# Patient Record
Sex: Female | Born: 1935 | Race: White | Hispanic: No | Marital: Single | Smoking: Never smoker
Health system: Southern US, Community
[De-identification: ages and names within clinical notes are randomized; demographics above are authoritative.]

## PROBLEM LIST (undated history)

## (undated) DIAGNOSIS — E039 Hypothyroidism, unspecified: Secondary | ICD-10-CM

## (undated) DIAGNOSIS — E785 Hyperlipidemia, unspecified: Secondary | ICD-10-CM

## (undated) DIAGNOSIS — M1990 Unspecified osteoarthritis, unspecified site: Secondary | ICD-10-CM

## (undated) DIAGNOSIS — R350 Frequency of micturition: Secondary | ICD-10-CM

## (undated) DIAGNOSIS — I10 Essential (primary) hypertension: Secondary | ICD-10-CM

## (undated) HISTORY — PX: INCONTINENCE SURGERY: SHX676

## (undated) HISTORY — DX: Hypothyroidism, unspecified: E03.9

## (undated) HISTORY — DX: Frequency of micturition: R35.0

## (undated) HISTORY — DX: Essential (primary) hypertension: I10

## (undated) HISTORY — DX: Hyperlipidemia, unspecified: E78.5

## (undated) HISTORY — DX: Unspecified osteoarthritis, unspecified site: M19.90

---

## 2004-03-18 ENCOUNTER — Ambulatory Visit: Payer: Self-pay | Admitting: Ophthalmology

## 2004-05-04 ENCOUNTER — Emergency Department: Payer: Self-pay | Admitting: Internal Medicine

## 2005-04-24 ENCOUNTER — Ambulatory Visit: Payer: Self-pay | Admitting: Internal Medicine

## 2015-12-16 ENCOUNTER — Ambulatory Visit (INDEPENDENT_AMBULATORY_CARE_PROVIDER_SITE_OTHER): Payer: Medicare Other | Admitting: Urology

## 2015-12-16 ENCOUNTER — Encounter: Payer: Self-pay | Admitting: Urology

## 2015-12-16 ENCOUNTER — Telehealth: Payer: Self-pay | Admitting: Urology

## 2015-12-16 VITALS — BP 146/73 | HR 81 | Ht 60.0 in | Wt 102.1 lb

## 2015-12-16 DIAGNOSIS — R350 Frequency of micturition: Secondary | ICD-10-CM | POA: Diagnosis not present

## 2015-12-16 DIAGNOSIS — R351 Nocturia: Secondary | ICD-10-CM

## 2015-12-16 LAB — BLADDER SCAN AMB NON-IMAGING: Scan Result: 36

## 2015-12-16 NOTE — Progress Notes (Signed)
12/16/2015 11:34 AM   Kelly Caldwell 05-09-1915 [id number]  Referring provider: No referring provider defined for this encounter.  Chief Complaint  Patient presents with  . New Patient (Initial Visit)    urinary freq referred by Dr. Juel BurrowMasoud    HPI: Patient is a 82 year old Caucasian female who presents today with her daughter, Kelly Caldwell, as a referral from Dr. Fredna DowMasoud's office for urinary frequency.  Patient states that she has been experiencing nocturia x 7 for the last several months.  She has been tried on three different medications without relief.  She has also tried restricting fluids and delaying bedtime in an effort to decrease her nocturia.  She is going through two depends nightly.  She is not having difficulty with urination during the day.   She is not having associated dysuria, intermittency, hesitancy, straining to urinate and weak urinary stream.   She does not have a history of urinary tract infections, STI's or injury to the bladder.   She denies gross hematuria, suprapubic pain, back pain, abdominal pain or flank pain.   She has not had any recent fevers, chills, nausea or vomiting.   She does not have a history of nephrolithiasis, GU surgery or GU trauma.   She is post menopausal.   She denies constipation and/or diarrhea.   She is not having pain with bladder filling.    She has not had any recent imaging studies.    She is drinking 2 to 3 of water daily.   She is drinking an occasional caffeinated beverages daily.  She is not drinking alcoholic beverages.      Her PVR today is 36 mL.     PMH: Past Medical History:  Diagnosis Date  . Arthritis   . HLD (hyperlipidemia)   . HTN (hypertension)   . Hypothyroidism   . Urinary frequency     Surgical History: Past Surgical History:  Procedure Laterality Date  . INCONTINENCE SURGERY      Home Medications:    Medication List       Accurate as of 12/16/15 11:34 AM. Always use your most recent med  list.          ALPRAZolam 0.25 MG tablet Commonly known as:  XANAX Take 0.25 mg by mouth at bedtime as needed for anxiety.   AMLODIPINE BESYLATE PO Take by mouth.   aspirin EC 81 MG tablet Take 81 mg by mouth.   atorvastatin 10 MG tablet Commonly known as:  LIPITOR Take 10 mg by mouth daily.   HYDROcodone-acetaminophen 5-325 MG tablet Commonly known as:  NORCO/VICODIN Take by mouth.   levothyroxine 88 MCG tablet Commonly known as:  SYNTHROID, LEVOTHROID Take 88 mcg by mouth daily before breakfast.   lisinopril 10 MG tablet Commonly known as:  PRINIVIL,ZESTRIL Take 10 mg by mouth daily.   metFORMIN 500 MG tablet Commonly known as:  GLUCOPHAGE Take by mouth 2 (two) times daily with a meal.       Allergies: No Known Allergies  Family History: Family History  Problem Relation Age of Onset  . Kidney disease Neg Hx   . Bladder Cancer Neg Hx     Social History:  reports that she has never smoked. She has never used smokeless tobacco. She reports that she does not drink alcohol or use drugs.  ROS: UROLOGY Frequent Urination?: Yes Hard to postpone urination?: Yes Burning/pain with urination?: No Get up at night to urinate?: Yes Leakage of urine?: Yes Urine stream starts  and stops?: No Trouble starting stream?: No Do you have to strain to urinate?: No Blood in urine?: No Urinary tract infection?: No Sexually transmitted disease?: No Injury to kidneys or bladder?: No Painful intercourse?: No Weak stream?: No Currently pregnant?: No Vaginal bleeding?: No Last menstrual period?: n  Gastrointestinal Nausea?: No Vomiting?: No Indigestion/heartburn?: No Diarrhea?: No Constipation?: No  Constitutional Fever: No Night sweats?: No Weight loss?: No Fatigue?: No  Skin Skin rash/lesions?: No Itching?: No  Eyes Blurred vision?: No Double vision?: No  Ears/Nose/Throat Sore throat?: No Sinus problems?: No  Hematologic/Lymphatic Swollen glands?:  No Easy bruising?: Yes  Cardiovascular Leg swelling?: No Chest pain?: No  Respiratory Cough?: No Shortness of breath?: No  Endocrine Excessive thirst?: No  Musculoskeletal Back pain?: No Joint pain?: Yes  Neurological Headaches?: No Dizziness?: No  Psychologic Depression?: No Anxiety?: No  Physical Exam: BP (!) 146/73   Pulse 81   Ht 5' (1.524 m)   Wt 102 lb 1.6 oz (46.3 kg)   BMI 19.94 kg/m   Constitutional: Well nourished. Alert and oriented, No acute distress. HEENT: [place] AT, moist mucus membranes. Trachea midline, no masses. Cardiovascular: No clubbing, cyanosis, or edema. Respiratory: Normal respiratory effort, no increased work of breathing. GI: Abdomen is soft, non tender, non distended, no abdominal masses. Liver and spleen not palpable.  No hernias appreciated.  Stool sample for occult testing is not indicated.   GU: No CVA tenderness.  No bladder fullness or masses.   Skin: No rashes, bruises or suspicious lesions. Lymph: No cervical or inguinal adenopathy. Neurologic: Grossly intact, no focal deficits, moving all 4 extremities. Psychiatric: Normal mood and affect.  Laboratory Data: Pertinent Imaging: Results for Kelly MusaNGLE, Kelly (MRN [id number]) as of 12/16/2015 11:01  Ref. Range 12/16/2015 10:58  Scan Result Unknown 36    Assessment & Plan:    1. Nocturia  - I explained to the patient that nocturia is often multi-factorial and difficult to treat.  Sleeping disorders, heart conditions, peripheral vascular disease, diabetes, an enlarged prostate for men, an urethral stricture causing bladder outlet obstruction and/or certain medications can contribute to nocturia.  - I have suggested that the patient avoid caffeine after noon and alcohol in the evening.  He or she may also benefit from fluid restrictions after 6:00 in the evening and voiding just prior to bedtime.  - I have explained that research studies have showed that over 84% of patients with  sleep apnea reported frequent nighttime urination.   With sleep apnea, oxygen decreases, carbon dioxide increases, the blood become more acidic, the heart rate drops and blood vessels in the lung constrict.  The body is then alerted that something is very wrong. The sleeper must wake enough to reopen the airway. By this time, the heart is racing and experiences a false signal of fluid overload. The heart excretes a hormone-like protein that tells the body to get rid of sodium and water, resulting in nocturia.  -  I also informed the patient that a recent study noted that decreasing sodium intake to 2.3 grams daily, if they don't have issues with hyponatremia, can also reduce the number of nightly voids  - The patient may benefit from a discussion with his or her primary care physician to see if he or she has risk factors for sleep apnea or other sleep disturbances and obtaining a sleep study.  - BLADDER SCAN AMB NON-IMAGING   Return for follow up after sleep study.  These notes generated with voice recognition  software. I apologize for typographical errors.  Zara Council, [facility] [address], [place], [place] [address] [phone]

## 2015-12-16 NOTE — Telephone Encounter (Signed)
Please call Morrie SheldonAshley at Dr. Fredna DowMasoud's office and let them know that I recommend a sleep study.  The patient would like it after Thanksgiving.

## 2015-12-16 NOTE — Telephone Encounter (Signed)
Spoke with Morrie SheldonAshley at Dr. Fredna DowMasoud's office. Morrie Sheldonshley voiced understanding.

## 2016-06-21 ENCOUNTER — Emergency Department: Payer: Medicare Other

## 2016-06-21 ENCOUNTER — Inpatient Hospital Stay
Admission: EM | Admit: 2016-06-21 | Discharge: 2016-06-23 | DRG: 481 | Disposition: A | Discharge status: Skilled Nursing Facility | Payer: Medicare Other | Attending: Internal Medicine | Admitting: Internal Medicine

## 2016-06-21 ENCOUNTER — Encounter
Admission: EM | Disposition: A | Discharge status: Skilled Nursing Facility | Payer: Self-pay | Source: Home / Self Care | Attending: Internal Medicine

## 2016-06-21 ENCOUNTER — Inpatient Hospital Stay: Payer: Medicare Other | Admitting: Anesthesiology

## 2016-06-21 ENCOUNTER — Inpatient Hospital Stay: Payer: Medicare Other

## 2016-06-21 DIAGNOSIS — S72011A Unspecified intracapsular fracture of right femur, initial encounter for closed fracture: Principal | ICD-10-CM | POA: Diagnosis present

## 2016-06-21 DIAGNOSIS — I350 Nonrheumatic aortic (valve) stenosis: Secondary | ICD-10-CM | POA: Diagnosis not present

## 2016-06-21 DIAGNOSIS — M810 Age-related osteoporosis without current pathological fracture: Secondary | ICD-10-CM | POA: Diagnosis present

## 2016-06-21 DIAGNOSIS — I360 Nonrheumatic tricuspid (valve) stenosis: Secondary | ICD-10-CM | POA: Diagnosis not present

## 2016-06-21 DIAGNOSIS — N390 Urinary tract infection, site not specified: Secondary | ICD-10-CM | POA: Diagnosis not present

## 2016-06-21 DIAGNOSIS — W010XXA Fall on same level from slipping, tripping and stumbling without subsequent striking against object, initial encounter: Secondary | ICD-10-CM | POA: Diagnosis present

## 2016-06-21 DIAGNOSIS — Y92009 Unspecified place in unspecified non-institutional (private) residence as the place of occurrence of the external cause: Secondary | ICD-10-CM | POA: Diagnosis not present

## 2016-06-21 DIAGNOSIS — Z7984 Long term (current) use of oral hypoglycemic drugs: Secondary | ICD-10-CM

## 2016-06-21 DIAGNOSIS — Z66 Do not resuscitate: Secondary | ICD-10-CM | POA: Diagnosis present

## 2016-06-21 DIAGNOSIS — E119 Type 2 diabetes mellitus without complications: Secondary | ICD-10-CM | POA: Diagnosis present

## 2016-06-21 DIAGNOSIS — S72001A Fracture of unspecified part of neck of right femur, initial encounter for closed fracture: Secondary | ICD-10-CM

## 2016-06-21 DIAGNOSIS — E785 Hyperlipidemia, unspecified: Secondary | ICD-10-CM | POA: Diagnosis present

## 2016-06-21 DIAGNOSIS — E039 Hypothyroidism, unspecified: Secondary | ICD-10-CM | POA: Diagnosis present

## 2016-06-21 DIAGNOSIS — Z01810 Encounter for preprocedural cardiovascular examination: Secondary | ICD-10-CM | POA: Diagnosis not present

## 2016-06-21 DIAGNOSIS — E876 Hypokalemia: Secondary | ICD-10-CM | POA: Diagnosis present

## 2016-06-21 DIAGNOSIS — S72009A Fracture of unspecified part of neck of unspecified femur, initial encounter for closed fracture: Secondary | ICD-10-CM | POA: Diagnosis present

## 2016-06-21 DIAGNOSIS — Z7982 Long term (current) use of aspirin: Secondary | ICD-10-CM | POA: Diagnosis not present

## 2016-06-21 DIAGNOSIS — Z85828 Personal history of other malignant neoplasm of skin: Secondary | ICD-10-CM | POA: Diagnosis not present

## 2016-06-21 DIAGNOSIS — I10 Essential (primary) hypertension: Secondary | ICD-10-CM | POA: Diagnosis present

## 2016-06-21 DIAGNOSIS — M25551 Pain in right hip: Secondary | ICD-10-CM | POA: Diagnosis present

## 2016-06-21 HISTORY — PX: HIP PINNING,CANNULATED: SHX1758

## 2016-06-21 LAB — URINALYSIS, COMPLETE (UACMP) WITH MICROSCOPIC
Bilirubin Urine: NEGATIVE
Glucose, UA: 50 mg/dL — AB
HGB URINE DIPSTICK: NEGATIVE
Ketones, ur: NEGATIVE mg/dL
LEUKOCYTES UA: NEGATIVE
Nitrite: NEGATIVE
Protein, ur: NEGATIVE mg/dL
Specific Gravity, Urine: 1.005 (ref 1.005–1.030)
pH: 8 (ref 5.0–8.0)

## 2016-06-21 LAB — CBC WITH DIFFERENTIAL/PLATELET
Basophils Absolute: 0 10*3/uL (ref 0–0.1)
Basophils Relative: 0 %
EOS ABS: 0.1 10*3/uL (ref 0–0.7)
EOS PCT: 1 %
HCT: 37.5 % (ref 35.0–47.0)
Hemoglobin: 12.6 g/dL (ref 12.0–16.0)
LYMPHS ABS: 1.5 10*3/uL (ref 1.0–3.6)
LYMPHS PCT: 10 %
MCH: 33.3 pg (ref 26.0–34.0)
MCHC: 33.6 g/dL (ref 32.0–36.0)
MCV: 99 fL (ref 80.0–100.0)
MONO ABS: 1.2 10*3/uL — AB (ref 0.2–0.9)
MONOS PCT: 8 %
Neutro Abs: 12.4 10*3/uL — ABNORMAL HIGH (ref 1.4–6.5)
Neutrophils Relative %: 81 %
PLATELETS: 235 10*3/uL (ref 150–440)
RBC: 3.79 MIL/uL — ABNORMAL LOW (ref 3.80–5.20)
RDW: 14.7 % — AB (ref 11.5–14.5)
WBC: 15.3 10*3/uL — AB (ref 3.6–11.0)

## 2016-06-21 LAB — COMPREHENSIVE METABOLIC PANEL
ALK PHOS: 125 U/L (ref 38–126)
ALT: 19 U/L (ref 14–54)
AST: 31 U/L (ref 15–41)
Albumin: 3.7 g/dL (ref 3.5–5.0)
Anion gap: 8 (ref 5–15)
BUN: 12 mg/dL (ref 6–20)
CALCIUM: 9.4 mg/dL (ref 8.9–10.3)
CO2: 30 mmol/L (ref 22–32)
CREATININE: 0.56 mg/dL (ref 0.44–1.00)
Chloride: 102 mmol/L (ref 101–111)
GFR calc non Af Amer: >60 mL/min (ref >60)
GLUCOSE: 180 mg/dL — AB (ref 65–99)
Potassium: 3 mmol/L — ABNORMAL LOW (ref 3.5–5.1)
SODIUM: 140 mmol/L (ref 135–145)
Total Bilirubin: 0.7 mg/dL (ref 0.3–1.2)
Total Protein: 7.3 g/dL (ref 6.5–8.1)

## 2016-06-21 LAB — GLUCOSE, CAPILLARY
GLUCOSE-CAPILLARY: 179 mg/dL — AB (ref 65–99)
GLUCOSE-CAPILLARY: 138 mg/dL — AB (ref 65–99)
GLUCOSE-CAPILLARY: 220 mg/dL — AB (ref 65–99)
Glucose-Capillary: 134 mg/dL — ABNORMAL HIGH (ref 65–99)

## 2016-06-21 LAB — SURGICAL PCR SCREEN
MRSA, PCR: NEGATIVE
STAPHYLOCOCCUS AUREUS: NEGATIVE

## 2016-06-21 LAB — BRAIN NATRIURETIC PEPTIDE: B Natriuretic Peptide: 145 pg/mL — ABNORMAL HIGH (ref 0.0–100.0)

## 2016-06-21 LAB — TROPONIN I: Troponin I: <0.03 ng/mL (ref <0.03)

## 2016-06-21 SURGERY — FIXATION, FEMUR, NECK, PERCUTANEOUS, USING SCREW
Anesthesia: Spinal | Laterality: Right

## 2016-06-21 MED ORDER — SODIUM CHLORIDE 0.9 % IV SOLN
Freq: Once | INTRAVENOUS | Status: AC
  Administered 2016-06-21: 19:00:00 via INTRAVENOUS

## 2016-06-21 MED ORDER — ADULT MULTIVITAMIN W/MINERALS CH
1.0000 | ORAL_TABLET | Freq: Every day | ORAL | Status: DC
  Administered 2016-06-22 – 2016-06-23 (×2): 1 via ORAL
  Filled 2016-06-21 (×2): qty 1

## 2016-06-21 MED ORDER — DOCUSATE SODIUM 100 MG PO CAPS
100.0000 mg | ORAL_CAPSULE | Freq: Every day | ORAL | Status: DC
  Administered 2016-06-22 – 2016-06-23 (×2): 100 mg via ORAL
  Filled 2016-06-21 (×2): qty 1

## 2016-06-21 MED ORDER — FUROSEMIDE 10 MG/ML IJ SOLN
5.0000 mg | Freq: Once | INTRAMUSCULAR | Status: AC
  Administered 2016-06-21: 5 mg via INTRAVENOUS

## 2016-06-21 MED ORDER — PROPOFOL 500 MG/50ML IV EMUL
INTRAVENOUS | Status: AC
  Filled 2016-06-21: qty 50

## 2016-06-21 MED ORDER — VITAMIN C 500 MG PO TABS
1000.0000 mg | ORAL_TABLET | Freq: Every day | ORAL | Status: DC
  Administered 2016-06-22 – 2016-06-23 (×2): 1000 mg via ORAL
  Filled 2016-06-21 (×2): qty 2

## 2016-06-21 MED ORDER — FUROSEMIDE 10 MG/ML IJ SOLN
INTRAMUSCULAR | Status: AC
  Filled 2016-06-21: qty 2

## 2016-06-21 MED ORDER — ENOXAPARIN SODIUM 30 MG/0.3ML ~~LOC~~ SOLN
30.0000 mg | SUBCUTANEOUS | Status: DC
  Administered 2016-06-22 – 2016-06-23 (×2): 30 mg via SUBCUTANEOUS
  Filled 2016-06-21 (×2): qty 0.3

## 2016-06-21 MED ORDER — ATORVASTATIN CALCIUM 20 MG PO TABS
40.0000 mg | ORAL_TABLET | Freq: Every day | ORAL | Status: DC
  Administered 2016-06-21 – 2016-06-22 (×2): 40 mg via ORAL
  Filled 2016-06-21 (×2): qty 2

## 2016-06-21 MED ORDER — ONDANSETRON HCL 4 MG/2ML IJ SOLN
4.0000 mg | Freq: Four times a day (QID) | INTRAMUSCULAR | Status: DC | PRN
  Administered 2016-06-22: 4 mg via INTRAVENOUS
  Filled 2016-06-21: qty 2

## 2016-06-21 MED ORDER — MORPHINE SULFATE (PF) 2 MG/ML IV SOLN
2.0000 mg | Freq: Once | INTRAVENOUS | Status: AC
  Administered 2016-06-21: 2 mg via INTRAVENOUS
  Filled 2016-06-21: qty 1

## 2016-06-21 MED ORDER — ONDANSETRON HCL 4 MG PO TABS: 4.0000 mg | ORAL_TABLET | Freq: Four times a day (QID) | ORAL | Status: DC | PRN

## 2016-06-21 MED ORDER — TRAMADOL HCL 50 MG PO TABS
50.0000 mg | ORAL_TABLET | Freq: Four times a day (QID) | ORAL | Status: DC | PRN
Start: 2016-06-21 — End: 2016-06-23
  Administered 2016-06-21: 50 mg via ORAL
  Filled 2016-06-21: qty 1

## 2016-06-21 MED ORDER — AMLODIPINE BESYLATE 5 MG PO TABS
2.5000 mg | ORAL_TABLET | Freq: Every day | ORAL | Status: DC
  Administered 2016-06-22 – 2016-06-23 (×2): 2.5 mg via ORAL
  Filled 2016-06-21 (×2): qty 1

## 2016-06-21 MED ORDER — BUPIVACAINE HCL (PF) 0.5 % IJ SOLN
INTRAMUSCULAR | Status: DC | PRN
  Administered 2016-06-21: 2.5 mL

## 2016-06-21 MED ORDER — LISINOPRIL 5 MG PO TABS
2.5000 mg | ORAL_TABLET | Freq: Every day | ORAL | Status: DC
  Administered 2016-06-22 – 2016-06-23 (×2): 2.5 mg via ORAL
  Filled 2016-06-21 (×2): qty 1

## 2016-06-21 MED ORDER — SODIUM CHLORIDE 0.9 % IV SOLN
INTRAVENOUS | Status: DC | PRN
  Administered 2016-06-21: 16:00:00 via INTRAVENOUS

## 2016-06-21 MED ORDER — ONDANSETRON HCL 4 MG/2ML IJ SOLN
4.0000 mg | Freq: Once | INTRAMUSCULAR | Status: AC
Start: 2016-06-21 — End: 2016-06-21
  Administered 2016-06-21: 4 mg via INTRAVENOUS
  Filled 2016-06-21: qty 2

## 2016-06-21 MED ORDER — ACETAMINOPHEN 650 MG RE SUPP: 650.0000 mg | Freq: Four times a day (QID) | RECTAL | Status: DC | PRN

## 2016-06-21 MED ORDER — BUPIVACAINE HCL (PF) 0.5 % IJ SOLN
INTRAMUSCULAR | Status: AC
  Filled 2016-06-21: qty 10

## 2016-06-21 MED ORDER — PROPOFOL 10 MG/ML IV BOLUS
INTRAVENOUS | Status: DC | PRN
  Administered 2016-06-21 (×2): 10 mg via INTRAVENOUS

## 2016-06-21 MED ORDER — METOPROLOL TARTRATE 5 MG/5ML IV SOLN: 5.0000 mg | INTRAVENOUS | Status: DC | PRN

## 2016-06-21 MED ORDER — CEFAZOLIN SODIUM 1 G IJ SOLR
INTRAMUSCULAR | Status: AC
  Filled 2016-06-21: qty 10

## 2016-06-21 MED ORDER — ACETAMINOPHEN 325 MG PO TABS
325.0000 mg | ORAL_TABLET | Freq: Four times a day (QID) | ORAL | Status: DC | PRN
  Administered 2016-06-22 – 2016-06-23 (×2): 325 mg via ORAL
  Filled 2016-06-21 (×2): qty 1

## 2016-06-21 MED ORDER — INSULIN ASPART 100 UNIT/ML ~~LOC~~ SOLN
0.0000 [IU] | Freq: Every day | SUBCUTANEOUS | Status: DC
  Administered 2016-06-21: 2 [IU] via SUBCUTANEOUS
  Filled 2016-06-21: qty 2

## 2016-06-21 MED ORDER — CEFAZOLIN SODIUM 1 G IJ SOLR
INTRAMUSCULAR | Status: DC | PRN
  Administered 2016-06-21: 1 g via INTRAMUSCULAR

## 2016-06-21 MED ORDER — INSULIN ASPART 100 UNIT/ML ~~LOC~~ SOLN
0.0000 [IU] | Freq: Three times a day (TID) | SUBCUTANEOUS | Status: DC
  Administered 2016-06-22: 1 [IU] via SUBCUTANEOUS
  Administered 2016-06-22 – 2016-06-23 (×3): 2 [IU] via SUBCUTANEOUS
  Administered 2016-06-23: 1 [IU] via SUBCUTANEOUS
  Filled 2016-06-21: qty 2
  Filled 2016-06-21 (×2): qty 1
  Filled 2016-06-21 (×2): qty 2

## 2016-06-21 MED ORDER — LEVOTHYROXINE SODIUM 100 MCG PO TABS: 100.0000 ug | ORAL_TABLET | Freq: Every day | ORAL | Status: DC

## 2016-06-21 MED ORDER — PANTOPRAZOLE SODIUM 40 MG PO TBEC
40.0000 mg | DELAYED_RELEASE_TABLET | Freq: Every day | ORAL | Status: DC
  Administered 2016-06-22 – 2016-06-23 (×2): 40 mg via ORAL
  Filled 2016-06-21 (×2): qty 1

## 2016-06-21 MED ORDER — PROPOFOL 500 MG/50ML IV EMUL
INTRAVENOUS | Status: DC | PRN
  Administered 2016-06-21: 25 ug/kg/min via INTRAVENOUS

## 2016-06-21 MED ORDER — ALPRAZOLAM 0.5 MG PO TABS
0.5000 mg | ORAL_TABLET | Freq: Every day | ORAL | Status: DC
  Administered 2016-06-21 – 2016-06-22 (×2): 0.5 mg via ORAL
  Filled 2016-06-21 (×2): qty 1

## 2016-06-21 MED ORDER — MORPHINE SULFATE (PF) 2 MG/ML IV SOLN: 2.0000 mg | INTRAVENOUS | Status: DC | PRN

## 2016-06-21 MED ORDER — POTASSIUM CHLORIDE CRYS ER 20 MEQ PO TBCR
40.0000 meq | EXTENDED_RELEASE_TABLET | ORAL | Status: AC
  Administered 2016-06-21: 40 meq via ORAL
  Filled 2016-06-21: qty 2

## 2016-06-21 SURGICAL SUPPLY — 32 items
BAG COUNTER SPONGE EZ (MISCELLANEOUS) ×2 IMPLANT
BIT DRILL CANN LRG QC 5X300 (BIT) ×2 IMPLANT
CANISTER SUCT 1200ML W/VALVE (MISCELLANEOUS) ×2 IMPLANT
DRSG OPSITE POSTOP 4X6 (GAUZE/BANDAGES/DRESSINGS) ×2 IMPLANT
DURAPREP 26ML APPLICATOR (WOUND CARE) ×2 IMPLANT
ELECT REM PT RETURN 9FT ADLT (ELECTROSURGICAL) ×2
ELECTRODE REM PT RTRN 9FT ADLT (ELECTROSURGICAL) ×1 IMPLANT
GAUZE SPONGE 4X4 12PLY STRL (GAUZE/BANDAGES/DRESSINGS) IMPLANT
GLOVE BIOGEL PI IND STRL 8 (GLOVE) ×1 IMPLANT
GLOVE BIOGEL PI INDICATOR 8 (GLOVE) ×1
GLOVE INDICATOR 7.5 STRL GRN (GLOVE) ×2 IMPLANT
GLOVE SURG ORTHO 8.0 STRL STRW (GLOVE) ×6 IMPLANT
GOWN STRL REUS W/ TWL LRG LVL3 (GOWN DISPOSABLE) ×2 IMPLANT
GOWN STRL REUS W/TWL LRG LVL3 (GOWN DISPOSABLE) ×2
GUIDEWIRE THREADED 2.8 (WIRE) ×8 IMPLANT
IRRIGATION STRYKERFLOW (MISCELLANEOUS) IMPLANT
IRRIGATOR STRYKERFLOW (MISCELLANEOUS)
KIT RM TURNOVER CYSTO AR (KITS) ×2 IMPLANT
KIT RM TURNOVER STRD PROC AR (KITS) ×2 IMPLANT
MAT FLOOR SUCTION 50X34 (MISCELLANEOUS) IMPLANT
NS IRRIG 1000ML POUR BTL (IV SOLUTION) ×2 IMPLANT
PACK HIP COMPR (MISCELLANEOUS) ×2 IMPLANT
SCREW CANN 16 THRD/70 7.3 (Screw) ×2 IMPLANT
SCREW CANN 16 THRD/75 7.3 (Screw) ×4 IMPLANT
STAPLER SKIN PROX 35W (STAPLE) ×2 IMPLANT
SUT VIC AB 0 CT1 36 (SUTURE) ×2 IMPLANT
SUT VIC AB 1 CT1 36 (SUTURE) ×4 IMPLANT
SUT VIC AB 2-0 CT1 27 (SUTURE) ×1
SUT VIC AB 2-0 CT1 TAPERPNT 27 (SUTURE) ×1 IMPLANT
TAPE MICROFOAM 4IN (TAPE) IMPLANT
WASHER FOR 5.0 SCREWS (Washer) ×6 IMPLANT
WATER STERILE IRR 1000ML POUR (IV SOLUTION) ×2 IMPLANT

## 2016-06-21 NOTE — Anesthesia Post-op Follow-up Note (Cosign Needed)
Anesthesia QCDR form completed.        

## 2016-06-21 NOTE — Anesthesia Procedure Notes (Signed)
Spinal  Patient location during procedure: OR Start time: 06/21/2016 4:20 PM End time: 06/21/2016 4:25 PM Staffing Resident/CRNA: Nelda Marseille Performed: resident/CRNA  Preanesthetic Checklist Completed: patient identified, site marked, surgical consent, pre-op evaluation, timeout performed, IV checked, risks and benefits discussed and monitors and equipment checked Spinal Block Patient position: right lateral decubitus Prep: Betadine Patient monitoring: heart rate, continuous pulse ox, blood pressure and cardiac monitor Approach: left paramedian Location: L3-4 Injection technique: single-shot Needle Needle type: [place] and Introducer  Needle gauge: 25 G Needle length: 9 cm Assessment Sensory level: T10 Additional Notes Negative paresthesia. Negative blood return. Positive free-flowing CSF. Expiration date of kit checked and confirmed. Patient tolerated procedure well, without complications.

## 2016-06-21 NOTE — Clinical Social Work Note (Signed)
Clinical Social Work Assessment  Patient Details  Name: Kelly Caldwell MRN: [id number] Date of Birth: Jan 11, 1916  Date of referral:  06/21/16               Reason for consult:  Discharge Planning, Facility Placement                Permission sought to share information with:  Family Supports, Customer service manager Permission granted to share information::  Yes, Verbal Permission Granted  Name::        Agency::  All facilities  Relationship::     Contact Information:  Daughter Terrial Rhodes [phone] Solon Palm Son in [place]  Housing/Transportation Living arrangements for the past 2 months:  [place] of Information:  Adult Children Patient Interpreter Needed:  None Criminal Activity/Legal Involvement Pertinent to Current Situation/Hospitalization:    Significant Relationships:  Adult Children Lives with:    Do you feel safe going back to the place where you live?  Yes Need for family participation in patient care:  Yes (Comment)  Care giving concerns: Family reports patient very independent and has had some recent falls   Facilities manager / plan: LCSW met with patient and obtained verbal consent to speak to her present family members. Introduced myself to her daughter Terrial Rhodes and son in law Solon Palm [phone]. Patient is a 82 year old female who up to 3 days ago was able to ambulate, dress herself, make her meals and take care of personal needs and is oriented x4. As per daughter she lives in her own home across the street and they visit and support patient daily. Patient is hard of hearing ,good vision and able to verbalize her needs. Family is agreeable to have patient go to STR/SNF if this is recommended by doctor or  Pt can return home with in home supports. They will make a decision closer to d/c. Family is OK to send out information to SNF for discharge planning.  Employment status:  Retired Forensic scientist:  Other  (Comment Required) Therapist, occupational) PT Recommendations:    Information / Referral to community resources:  [place]  Patient/Family's Response to care:  Family has a good understanding of patients needs and will consider STR/SNF if required and pleased all questions answered  Patient/Family's Understanding of and Emotional Response to Diagnosis, Current Treatment, and Prognosis:  Family has good understanding of admission process and discharge planning  Emotional Assessment Appearance:  Appears stated age Attitude/Demeanor/Rapport:   (Calm polite and oriented x4) Affect (typically observed):  Accepting, Calm, Hopeful Orientation:  Oriented to Self, Oriented to Place, Oriented to Situation Alcohol / Substance use:  Not Applicable Psych involvement (Current and /or in the community):  No (Comment)  Discharge Needs  Concerns to be addressed:  Adjustment to Illness Readmission within the last 30 days:    Current discharge risk:  None Barriers to Discharge:  Continued Medical Work up   [place], [place], LCSW 06/21/2016, 11:25 AM

## 2016-06-21 NOTE — Consult Note (Signed)
CARDIOLOGY CONSULT NOTE     Primary Care Physician: Corky DownsMasoud, Javed, MD Referring Physician:  Dr Amado CoeGouru  Admit Date: 06/21/2016  Reason for consultation:  preop assessment  Kelly MusaMarguerite Moncur is a 83 y.o. female with a h/o no prior cardiac history who now presents with hip fracture.  Cardiology is consulted for preoperative clearance.  The patient reports being "healthy" and has not had any prior cardiac history.  This am, she reports that she tripped over clothes and fell.  She has developed R femoral fracture.  She is clear that she did not have syncope prior to her fall. She is not very active.  Denies prior cardiac symptoms.   Today, she denies symptoms of palpitations, chest pain, shortness of breath, orthopnea, PND, lower extremity edema, dizziness, presyncope, syncope, or neurologic sequela. The patient is tolerating medications without difficulties and is otherwise without complaint today.   Past Medical History:  Diagnosis Date  . Arthritis   . HLD (hyperlipidemia)   . HTN (hypertension)   . Hypothyroidism   . Urinary frequency    Past Surgical History:  Procedure Laterality Date  . INCONTINENCE SURGERY      . ALPRAZolam  0.5 mg Oral QHS  . amLODipine  2.5 mg Oral Daily  . atorvastatin  40 mg Oral QHS  . docusate sodium  100 mg Oral Daily  . insulin aspart  0-5 Units Subcutaneous QHS  . insulin aspart  0-9 Units Subcutaneous TID WC  . levothyroxine  100 mcg Oral QAC breakfast  . lisinopril  2.5 mg Oral Daily  . multivitamin with minerals  1 tablet Oral Daily  . pantoprazole  40 mg Oral Daily  . potassium chloride  40 mEq Oral Q4H  . vitamin C  1,000 mg Oral Daily     No Known Allergies  Social History   Social History  . Marital status: Single    Spouse name: N/A  . Number of children: N/A  . Years of education: N/A   Occupational History  . Not on file.   Social History Main Topics  . Smoking status: Never Smoker  . Smokeless tobacco: Never Used  .  Alcohol use No  . Drug use: No  . Sexual activity: Not on file   Other Topics Concern  . Not on file   Social History Narrative   Lives alone in     Family History  Problem Relation Age of Onset  . Kidney disease Neg Hx   . Bladder Cancer Neg Hx   son died of cancer  ROS- All systems are reviewed and negative except as per the HPI above  Physical Exam: Telemetry: Vitals:   06/21/16 0830 06/21/16 1037 06/21/16 1329  BP: (!) 186/81 (!) 154/76 (!) 160/80  Pulse: 82 79   Resp: 16 18 16   Temp: 98.1 F (36.7 C)  98.4 F (36.9 C)  TempSrc: Oral  Oral  SpO2: (!) 89% 94% 98%  Weight: 100 lb (45.4 kg)    Height: 5' (1.524 m)      GEN- The patient is elderly and very thin appearing, alert and oriented x 3 today.   Head- normocephalic, atraumatic Eyes-  Sclera clear, conjunctiva pink Ears- hearing intact Oropharynx- clear Neck- supple,   Lungs- Clear to ausculation bilaterally, normal work of breathing Heart- Regular rate and rhythm, 2/6 SEM LUSB which is early peaking GI- soft, NT, ND, + BS Extremities- no clubbing, cyanosis, or edema MS- diffuse muscle atrophy Skin- no rash or lesion Psych-  euthymic mood, full affect Neuro- strength and sensation are intact  EKG tracing is personally reviewed and reveals sinus rhythm with PACs, no ischemic changes  Labs:   Lab Results  Component Value Date   WBC 15.3 (H) 06/21/2016   HGB 12.6 06/21/2016   HCT 37.5 06/21/2016   MCV 99.0 06/21/2016   PLT 235 06/21/2016    Recent Labs Lab 06/21/16 0835  NA 140  K 3.0*  CL 102  CO2 30  BUN 12  CREATININE 0.56  CALCIUM 9.4  PROT 7.3  BILITOT 0.7  ALKPHOS 125  ALT 19  AST 31  GLUCOSE 180*    ASSESSMENT AND PLAN:    1. Preoperative assessment Pt without prior cardiac history or symptoms, though not very active. Given her advanced age, I do think that she would be at increased risk for any procedures.  I do not feel that further CV testing however would  reduce her surgical risk.  I would therefore advise that she proceed with surgery if medically indicated without further CV testing at this time. Please call cardiology if she develops any difficulty while here.  Cardiology to see as needed otherwise.   Hillis Range, MD 06/21/2016  2:20 PM

## 2016-06-21 NOTE — ED Notes (Addendum)
1st attempt at catheter insertion-unsuccessful by this nurse; receiving nurse notified

## 2016-06-21 NOTE — ED Triage Notes (Addendum)
Pt came to ED via EMS from home. Pt has unwitnessed fall, pt does not remember falling. Thinks she slipped out the end of her bed. Reports right hip pain. Alert and oriented. Not on blood thinners.

## 2016-06-21 NOTE — Anesthesia Preprocedure Evaluation (Addendum)
Anesthesia Evaluation  Patient identified by MRN, date of birth, ID band Patient awake    Reviewed: Allergy & Precautions, NPO status , Patient's Chart, lab work & pertinent test results  Airway Mallampati: III  TM Distance: <3 FB     Dental  (+) Missing, Caps   Pulmonary neg pulmonary ROS,    Pulmonary exam normal        Cardiovascular hypertension, Pt. on medications Normal cardiovascular exam     Neuro/Psych negative neurological ROS  negative psych ROS   GI/Hepatic   Endo/Other  diabetes, Type 2, Oral Hypoglycemic AgentsHypothyroidism   Renal/GU  Bladder dysfunction      Musculoskeletal  (+) Arthritis , Osteoarthritis,    Abdominal Normal abdominal exam  (+)   Peds negative pediatric ROS (+)  Hematology   Anesthesia Other Findings Past Medical History: No date: Arthritis No date: HLD (hyperlipidemia) No date: HTN (hypertension) No date: Hypothyroidism No date: Urinary frequency  Reproductive/Obstetrics                           Anesthesia Physical Anesthesia Plan  ASA: III and emergent  Anesthesia Plan: Spinal   Post-op Pain Management:    Induction: Intravenous  Airway Management Planned: Nasal Cannula  Additional Equipment:   Intra-op Plan:   Post-operative Plan:   Informed Consent: I have reviewed the patients History and Physical, chart, labs and discussed the procedure including the risks, benefits and alternatives for the proposed anesthesia with the patient or authorized representative who has indicated his/her understanding and acceptance.     Plan Discussed with: CRNA and Surgeon  Anesthesia Plan Comments:         Anesthesia Quick Evaluation

## 2016-06-21 NOTE — H&P (Signed)
[facility] - [place] at [facility]   PATIENT NAME: Kelly Caldwell    MR#:  [id number]  DATE OF BIRTH:  08-07-1915  DATE OF ADMISSION:  06/21/2016  PRIMARY CARE PHYSICIAN: Corky Downs, MD   REQUESTING/REFERRING PHYSICIAN: Malinda  CHIEF COMPLAINT:   fall HISTORY OF PRESENT ILLNESS:  Kelly Caldwell  is a 83 y.o. female with a known history of HTN,hypothyroidism, DM,hyperlipidemia and basal cell carcinoma of skin is brought into the emergency department after she sustained a fall. She was reporting right hip pain. X-ray has revealed impacted right femoral subcapital fracture. Chest x-ray with pulmonary vascular congestion but patient does not have any history of congestive heart failure or acute MI in the past. She could ambulate with the help of walker and minimal assistance  PAST MEDICAL HISTORY:   Past Medical History:  Diagnosis Date  . Arthritis   . HLD (hyperlipidemia)   . HTN (hypertension)   . Hypothyroidism   . Urinary frequency     PAST SURGICAL HISTOIRY:   Past Surgical History:  Procedure Laterality Date  . INCONTINENCE SURGERY      SOCIAL HISTORY:   Social History  Substance Use Topics  . Smoking status: Never Smoker  . Smokeless tobacco: Never Used  . Alcohol use No    FAMILY HISTORY:   Family History  Problem Relation Age of Onset  . Kidney disease Neg Hx   . Bladder Cancer Neg Hx     DRUG ALLERGIES:  No Known Allergies  REVIEW OF SYSTEMS:  CONSTITUTIONAL: No fever, fatigue or weakness.  EYES: No blurred or double vision.  EARS, NOSE, AND THROAT: No tinnitus or ear pain.  RESPIRATORY: No cough, shortness of breath, wheezing or hemoptysis.  CARDIOVASCULAR: No chest pain, orthopnea, edema.  GASTROINTESTINAL: No nausea, vomiting, diarrhea or abdominal pain.  GENITOURINARY: No dysuria, hematuria.  ENDOCRINE: No polyuria, nocturia,  HEMATOLOGY: No anemia, easy bruising or bleeding SKIN: No rash or  lesion. MUSCULOSKELETAL: Reporting right hip pain NEUROLOGIC: No tingling, numbness, weakness.  PSYCHIATRY: No anxiety or depression.   MEDICATIONS AT HOME:   Prior to Admission medications   Medication Sig Start Date End Date Taking? Authorizing Provider  ALPRAZolam Prudy Feeler) 0.5 MG tablet Take 0.5 mg by mouth at bedtime.    Yes [provider]  amLODipine (NORVASC) 2.5 MG tablet Take 1 tablet by mouth daily.    Yes [provider]  Ascorbic Acid (VITAMIN C) 1000 MG tablet Take 1,000 mg by mouth daily.   Yes [provider]  aspirin EC 81 MG tablet Take 81 mg by mouth at bedtime.    Yes [provider]  atorvastatin (LIPITOR) 40 MG tablet Take 40 mg by mouth at bedtime.    Yes [provider]  cholecalciferol (VITAMIN D) 1000 units tablet Take 4,000 Units by mouth daily.   Yes [provider]  Coenzyme Q10 (CO Q-10) 200 MG CAPS Take 1 capsule by mouth daily.   Yes [provider]  ibuprofen (ADVIL,MOTRIN) 200 MG tablet Take 200 mg by mouth every 6 (six) hours as needed.   Yes [provider]  levothyroxine (SYNTHROID, LEVOTHROID) 100 MCG tablet Take 100 mcg by mouth daily before breakfast.    Yes [provider]  lisinopril (PRINIVIL,ZESTRIL) 2.5 MG tablet Take 2.5 mg by mouth daily.    Yes [provider]  metFORMIN (GLUCOPHAGE) 1000 MG tablet Take 1,000 mg by mouth 2 (two) times daily with a meal.    Yes  [provider]  Multiple Vitamin (MULTIVITAMIN) tablet Take 1 tablet by mouth daily.   Yes [provider]      VITAL SIGNS:  Blood pressure (!) 154/76, pulse 79, temperature 98.1 F (36.7 C), temperature source Oral, resp. rate 18, height 5' (1.524 m), weight 45.4 kg (100 lb), SpO2 94 %.  PHYSICAL EXAMINATION:  GENERAL:  83 y.o.-year-old patient lying in the bed with no acute distress.  EYES: Pupils equal, round, reactive to light and accommodation. No scleral icterus.  Extraocular muscles intact.  HEENT: Head atraumatic, normocephalic. Oropharynx and nasopharynx clear.  NECK:  Supple, no jugular venous distention. No thyroid enlargement, no tenderness.  LUNGS: Normal breath sounds bilaterally, no wheezing, rales,rhonchi or crepitation. No use of accessory muscles of respiration.  CARDIOVASCULAR: S1, S2 normal. Has systolic murmurs, no  rubs, or gallops.  ABDOMEN: Soft, nontender, nondistended. Bowel sounds present. No organomegaly or mass.  EXTREMITIES: Right hip is tender abducted No pedal edema, cyanosis, or clubbing.  NEUROLOGIC: Cranial nerves II through XII are intact.sensory intact Gait not checked.  PSYCHIATRIC: The patient is alert and oriented x 3.  SKIN: No obvious rash, lesion, or ulcer.   LABORATORY PANEL:   CBC  Recent Labs Lab 06/21/16 0835  WBC 15.3*  HGB 12.6  HCT 37.5  PLT 235   ------------------------------------------------------------------------------------------------------------------  Chemistries   Recent Labs Lab 06/21/16 0835  NA 140  K 3.0*  CL 102  CO2 30  GLUCOSE 180*  BUN 12  CREATININE 0.56  CALCIUM 9.4  AST 31  ALT 19  ALKPHOS 125  BILITOT 0.7   ------------------------------------------------------------------------------------------------------------------  Cardiac Enzymes  Recent Labs Lab 06/21/16 0835  TROPONINI <0.03   ------------------------------------------------------------------------------------------------------------------ EKG with first-degree AV block, sinus tachycardia left ventricular  hypertrophy RADIOLOGY:  Ct Head Wo Contrast  Result Date: 06/21/2016 CLINICAL DATA:  Pt came to ED via EMS from home. Pt has unwitnessed fall, pt does not remember falling. Thinks she slipped out the end of her bed. Reports right hip pain. Not on blood thinners. EXAM: CT HEAD WITHOUT CONTRAST CT CERVICAL SPINE WITHOUT CONTRAST TECHNIQUE: Multidetector CT imaging of the head and cervical spine  was performed following the standard protocol without intravenous contrast. Multiplanar CT image reconstructions of the cervical spine were also generated. COMPARISON:  04/24/2005 FINDINGS: CT HEAD FINDINGS Brain: 2 cm hyperdense lesion in the right middle cranial fossa abutting the tentorium and involving the temporal lobe. Lesion not evident on prior study from 2007. No definite dural tail. No acute intracranial hemorrhage, midline shift, or focal parenchymal edema. Acute infarct may be inapparent on noncontrast CT. Mild diffuse parenchymal atrophy. Patchy areas of hypoattenuation in deep and periventricular white matter bilaterally. Ventricles and sulci normal in size and symmetry. Vascular: Atherosclerotic and physiologic intracranial calcifications. Skull: Normal. Negative for fracture or focal lesion. Sinuses/Orbits: No acute finding. Other: None. CT CERVICAL SPINE FINDINGS Alignment: Normal. Skull base and vertebrae: No acute fracture. No primary bone lesion or focal pathologic process. Soft tissues and spinal canal: No prevertebral fluid or swelling. No visible canal hematoma. Bilateral carotid arterial calcifications. Disc levels: C2-3 mild facet DJD right greater than left C3-4 mild narrowing of the interspace. Facet and uncovertebral DJD resulting in foraminal stenosis right worse than left. C4-5 Fusion across the facet joints bilaterally. C5-6 moderate narrowing of the interspace with posterior protrusion. C6-7 moderate narrowing of the interspace Upper chest: Negative. Other: Streak artifact from dental restorations. IMPRESSION: 1. Negative for bleed or other acute intracranial process. 2. 2 cm  mass in the right middle cranial fossa, new since 2007. Differential diagnosis includes benign meningioma, less likely intra-axial neoplasm or atypical aneurysm. Recommend elective MR head with contrast for further characterization. 3. Negative for cervical fracture or dislocation. 4. Multilevel cervical  spondylitic changes as enumerated above. Electronically Signed   By: Corlis Leak  Hassell M.D.   On: 06/21/2016 09:41   Ct Cervical Spine Wo Contrast  Result Date: 06/21/2016 CLINICAL DATA:  Pt came to ED via EMS from home. Pt has unwitnessed fall, pt does not remember falling. Thinks she slipped out the end of her bed. Reports right hip pain. Not on blood thinners. EXAM: CT HEAD WITHOUT CONTRAST CT CERVICAL SPINE WITHOUT CONTRAST TECHNIQUE: Multidetector CT imaging of the head and cervical spine was performed following the standard protocol without intravenous contrast. Multiplanar CT image reconstructions of the cervical spine were also generated. COMPARISON:  04/24/2005 FINDINGS: CT HEAD FINDINGS Brain: 2 cm hyperdense lesion in the right middle cranial fossa abutting the tentorium and involving the temporal lobe. Lesion not evident on prior study from 2007. No definite dural tail. No acute intracranial hemorrhage, midline shift, or focal parenchymal edema. Acute infarct may be inapparent on noncontrast CT. Mild diffuse parenchymal atrophy. Patchy areas of hypoattenuation in deep and periventricular white matter bilaterally. Ventricles and sulci normal in size and symmetry. Vascular: Atherosclerotic and physiologic intracranial calcifications. Skull: Normal. Negative for fracture or focal lesion. Sinuses/Orbits: No acute finding. Other: None. CT CERVICAL SPINE FINDINGS Alignment: Normal. Skull base and vertebrae: No acute fracture. No primary bone lesion or focal pathologic process. Soft tissues and spinal canal: No prevertebral fluid or swelling. No visible canal hematoma. Bilateral carotid arterial calcifications. Disc levels: C2-3 mild facet DJD right greater than left C3-4 mild narrowing of the interspace. Facet and uncovertebral DJD resulting in foraminal stenosis right worse than left. C4-5 Fusion across the facet joints bilaterally. C5-6 moderate narrowing of the interspace with posterior protrusion. C6-7  moderate narrowing of the interspace Upper chest: Negative. Other: Streak artifact from dental restorations. IMPRESSION: 1. Negative for bleed or other acute intracranial process. 2. 2 cm mass in the right middle cranial fossa, new since 2007. Differential diagnosis includes benign meningioma, less likely intra-axial neoplasm or atypical aneurysm. Recommend elective MR head with contrast for further characterization. 3. Negative for cervical fracture or dislocation. 4. Multilevel cervical spondylitic changes as enumerated above. Electronically Signed   By: Corlis Leak  Hassell M.D.   On: 06/21/2016 09:41   Dg Chest Portable 1 View  Result Date: 06/21/2016 CLINICAL DATA:  Syncope, fell EXAM: PORTABLE CHEST - 1 VIEW COMPARISON:  04/24/2005 FINDINGS: Large hiatal hernia, limiting evaluation of the lung bases. Mild pulmonary vascular congestion. Stable cardiomegaly.  Atheromatous aorta. Small right pleural effusion.  No pneumothorax. Mild thoracic dextroscoliosis. IMPRESSION: 1. Large hiatal hernia. 2. Small right pleural effusion. 3. No pneumothorax. 4. Central pulmonary vascular congestion, new since previous exam Electronically Signed   By: Corlis Leak  Hassell M.D.   On: 06/21/2016 10:32   Dg Hip Unilat W Or Wo Pelvis 2-3 Views Right  Result Date: 06/21/2016 CLINICAL DATA:  Pt had unwitnessed fall last night. Right hip pain since. No prior injury. EXAM: DG HIP (WITH OR WITHOUT PELVIS) 2-3V RIGHT COMPARISON:  None. FINDINGS: Impacted subcapital fracture, right femur. No dislocation. Diffuse osteopenia. Bony pelvis intact. Question of old fracture deformity, left iliac wing. Left pelvic phleboliths. IMPRESSION: Impacted right femoral subcapital fracture. Electronically Signed   By: Corlis Leak  Hassell M.D.   On: 06/21/2016 09:18  EKG:   Orders placed or performed during the hospital encounter of 06/21/16  . EKG 12-Lead  . EKG 12-Lead    IMPRESSION AND PLAN:   Kelly Caldwell  is a 83 y.o. female with a known history of  HTN,hypothyroidism, DM,hyperlipidemia and basal cell carcinoma of skin is brought into the emergency department after she sustained a fall. She was reporting right hip pain. X-ray has revealed impacted right femoral subcapital fracture. Chest x-ray with pulmonary vascular congestion  # fall with  impacted right femoral subcapital fracture Admit to MedSurg unit Pain management as needed Nothing by mouth except meds and ice chips Consult is placed to orthopedics Cardiology consult is placed for cardiac clearance of noncardiac surgery Will resume patient's home medication baby aspirin after surgery  #Hypokalemia replete potassium and recheck labs in a.m.  #Pulmonary vascular congestion with no history of congestive heart failure Patient is a symptomatic while resting. BNP is ordered Order echocardiogram Cardiac consult  #Diabetes mellitus Will check hemoglobin A1c and  start her on sliding scale insulin Hold metformin  #Essential hypertension-blood pressure elevated probably from pain Continue home medication Norvasc and lisinopril  # hyperlipidemia continue Lipitor  #Hypothyroidism continue Synthroid check TSH in a.m.  Patient is medically optimized for noncardiac surgery awaiting cardiac clearance  All the records are reviewed and case discussed with ED provider. Management plans discussed with the patient, family and they are in agreement.  CODE STATUS:  DNR , daughter is the HCPOA  TOTAL TIME TAKING CARE OF THIS PATIENT: 43  minutes.   Note: This dictation was prepared with Dragon dictation along with smaller phrase technology. Any transcriptional errors that result from this process are unintentional.  Ramonita Lab M.D on 06/21/2016 at 11:26 AM  Between 7am to 6pm - Pager - [phone]  After 6pm go to [email or web address] - password [email or web address] [facility]  [facility]  Office  [phone]  CC: Primary care physician; Corky Downs, MD

## 2016-06-21 NOTE — Progress Notes (Signed)
ADMISSION NOTE:  Pt admitted to room 149 from ED. Pt alert and oriented. Family at bedside. Pt oriented to room and call bell. Skin assessment completed with Marchelle FolksAmanda, RN. Sacral dressing applied, CHG wipes, MRSA PCR sent. Bed in lowest position, call bell within reach and bed alarm on.

## 2016-06-21 NOTE — Transfer of Care (Signed)
Immediate Anesthesia Transfer of Care Note  Patient: Kelly MusaMarguerite Caldwell  Procedure(s) Performed: Procedure(s): CANNULATED HIP PINNING (Right)  Patient Location: PACU  Anesthesia Type:Spinal  Level of Consciousness: awake, alert  and sedated  Airway & Oxygen Therapy: Patient Spontanous Breathing and Patient connected to face mask oxygen  Post-op Assessment: Report given to RN and Post -op Vital signs reviewed and stable  Post vital signs: Reviewed and stable  Last Vitals:  Vitals:   06/21/16 1037 06/21/16 1329  BP: (!) 154/76 (!) 160/80  Pulse: 79   Resp: 18 16  Temp:  36.9 C    Last Pain:  Vitals:   06/21/16 1329  TempSrc: Oral  PainSc:       Patients Stated Pain Goal: 3 (06/21/16 1129)  Complications: No apparent anesthesia complications

## 2016-06-21 NOTE — Consult Note (Signed)
ORTHOPAEDIC CONSULTATION  REQUESTING PHYSICIAN: Arnaldo Natal, MD  Chief Complaint: right hip pain  HPI: Kelly Caldwell is a 83 y.o. female who complains of  Right hip pain after a fall. She denies any LOC. She is normally a household ambulator without an assisitive device. She currently is unable to bear weight.  Past Medical History:  Diagnosis Date  . Arthritis   . HLD (hyperlipidemia)   . HTN (hypertension)   . Hypothyroidism   . Urinary frequency    Past Surgical History:  Procedure Laterality Date  . INCONTINENCE SURGERY     Social History   Social History  . Marital status: Single    Spouse name: N/A  . Number of children: N/A  . Years of education: N/A   Social History Main Topics  . Smoking status: Never Smoker  . Smokeless tobacco: Never Used  . Alcohol use No  . Drug use: No  . Sexual activity: Not Asked   Other Topics Concern  . None   Social History Narrative  . None   Family History  Problem Relation Age of Onset  . Kidney disease Neg Hx   . Bladder Cancer Neg Hx    No Known Allergies Prior to Admission medications   Medication Sig Start Date End Date Taking? Authorizing Provider  ALPRAZolam Prudy Feeler) 0.5 MG tablet Take 0.5 mg by mouth at bedtime.    Yes [provider]  amLODipine (NORVASC) 2.5 MG tablet Take 1 tablet by mouth daily.    Yes [provider]  Ascorbic Acid (VITAMIN C) 1000 MG tablet Take 1,000 mg by mouth daily.   Yes [provider]  aspirin EC 81 MG tablet Take 81 mg by mouth at bedtime.    Yes [provider]  atorvastatin (LIPITOR) 40 MG tablet Take 40 mg by mouth at bedtime.    Yes [provider]  cholecalciferol (VITAMIN D) 1000 units tablet Take 4,000 Units by mouth daily.   Yes [provider]  Coenzyme Q10 (CO Q-10) 200 MG CAPS Take 1 capsule by mouth daily.   Yes [provider]  ibuprofen (ADVIL,MOTRIN) 200 MG tablet Take 200 mg by mouth every 6 (six)  hours as needed.   Yes [provider]  levothyroxine (SYNTHROID, LEVOTHROID) 100 MCG tablet Take 100 mcg by mouth daily before breakfast.    Yes [provider]  lisinopril (PRINIVIL,ZESTRIL) 2.5 MG tablet Take 2.5 mg by mouth daily.    Yes [provider]  metFORMIN (GLUCOPHAGE) 1000 MG tablet Take 1,000 mg by mouth 2 (two) times daily with a meal.    Yes [provider]  Multiple Vitamin (MULTIVITAMIN) tablet Take 1 tablet by mouth daily.   Yes [provider]   Ct Head Wo Contrast  Result Date: 06/21/2016 CLINICAL DATA:  Pt came to ED via EMS from home. Pt has unwitnessed fall, pt does not remember falling. Thinks she slipped out the end of her bed. Reports right hip pain. Not on blood thinners. EXAM: CT HEAD WITHOUT CONTRAST CT CERVICAL SPINE WITHOUT CONTRAST TECHNIQUE: Multidetector CT imaging of the head and cervical spine was performed following the standard protocol without intravenous contrast. Multiplanar CT image reconstructions of the cervical spine were also generated. COMPARISON:  04/24/2005 FINDINGS: CT HEAD FINDINGS Brain: 2 cm hyperdense lesion in the right middle cranial fossa abutting the tentorium and involving the temporal lobe. Lesion not evident on prior study from 2007. No definite dural tail. No acute intracranial hemorrhage, midline  shift, or focal parenchymal edema. Acute infarct may be inapparent on noncontrast CT. Mild diffuse parenchymal atrophy. Patchy areas of hypoattenuation in deep and periventricular white matter bilaterally. Ventricles and sulci normal in size and symmetry. Vascular: Atherosclerotic and physiologic intracranial calcifications. Skull: Normal. Negative for fracture or focal lesion. Sinuses/Orbits: No acute finding. Other: None. CT CERVICAL SPINE FINDINGS Alignment: Normal. Skull base and vertebrae: No acute fracture. No primary bone lesion or focal pathologic process. Soft tissues and spinal canal: No  prevertebral fluid or swelling. No visible canal hematoma. Bilateral carotid arterial calcifications. Disc levels: C2-3 mild facet DJD right greater than left C3-4 mild narrowing of the interspace. Facet and uncovertebral DJD resulting in foraminal stenosis right worse than left. C4-5 Fusion across the facet joints bilaterally. C5-6 moderate narrowing of the interspace with posterior protrusion. C6-7 moderate narrowing of the interspace Upper chest: Negative. Other: Streak artifact from dental restorations. IMPRESSION: 1. Negative for bleed or other acute intracranial process. 2. 2 cm mass in the right middle cranial fossa, new since 2007. Differential diagnosis includes benign meningioma, less likely intra-axial neoplasm or atypical aneurysm. Recommend elective MR head with contrast for further characterization. 3. Negative for cervical fracture or dislocation. 4. Multilevel cervical spondylitic changes as enumerated above. Electronically Signed   By: Corlis Leak M.D.   On: 06/21/2016 09:41   Ct Cervical Spine Wo Contrast  Result Date: 06/21/2016 CLINICAL DATA:  Pt came to ED via EMS from home. Pt has unwitnessed fall, pt does not remember falling. Thinks she slipped out the end of her bed. Reports right hip pain. Not on blood thinners. EXAM: CT HEAD WITHOUT CONTRAST CT CERVICAL SPINE WITHOUT CONTRAST TECHNIQUE: Multidetector CT imaging of the head and cervical spine was performed following the standard protocol without intravenous contrast. Multiplanar CT image reconstructions of the cervical spine were also generated. COMPARISON:  04/24/2005 FINDINGS: CT HEAD FINDINGS Brain: 2 cm hyperdense lesion in the right middle cranial fossa abutting the tentorium and involving the temporal lobe. Lesion not evident on prior study from 2007. No definite dural tail. No acute intracranial hemorrhage, midline shift, or focal parenchymal edema. Acute infarct may be inapparent on noncontrast CT. Mild diffuse parenchymal  atrophy. Patchy areas of hypoattenuation in deep and periventricular white matter bilaterally. Ventricles and sulci normal in size and symmetry. Vascular: Atherosclerotic and physiologic intracranial calcifications. Skull: Normal. Negative for fracture or focal lesion. Sinuses/Orbits: No acute finding. Other: None. CT CERVICAL SPINE FINDINGS Alignment: Normal. Skull base and vertebrae: No acute fracture. No primary bone lesion or focal pathologic process. Soft tissues and spinal canal: No prevertebral fluid or swelling. No visible canal hematoma. Bilateral carotid arterial calcifications. Disc levels: C2-3 mild facet DJD right greater than left C3-4 mild narrowing of the interspace. Facet and uncovertebral DJD resulting in foraminal stenosis right worse than left. C4-5 Fusion across the facet joints bilaterally. C5-6 moderate narrowing of the interspace with posterior protrusion. C6-7 moderate narrowing of the interspace Upper chest: Negative. Other: Streak artifact from dental restorations. IMPRESSION: 1. Negative for bleed or other acute intracranial process. 2. 2 cm mass in the right middle cranial fossa, new since 2007. Differential diagnosis includes benign meningioma, less likely intra-axial neoplasm or atypical aneurysm. Recommend elective MR head with contrast for further characterization. 3. Negative for cervical fracture or dislocation. 4. Multilevel cervical spondylitic changes as enumerated above. Electronically Signed   By: Corlis Leak M.D.   On: 06/21/2016 09:41   Dg Chest Portable 1 View  Result Date: 06/21/2016 CLINICAL DATA:  Syncope, fell EXAM: PORTABLE CHEST - 1 VIEW COMPARISON:  04/24/2005 FINDINGS: Large hiatal hernia, limiting evaluation of the lung bases. Mild pulmonary vascular congestion. Stable cardiomegaly.  Atheromatous aorta. Small right pleural effusion.  No pneumothorax. Mild thoracic dextroscoliosis. IMPRESSION: 1. Large hiatal hernia. 2. Small right pleural effusion. 3. No  pneumothorax. 4. Central pulmonary vascular congestion, new since previous exam Electronically Signed   By: Corlis Leak  Hassell M.D.   On: 06/21/2016 10:32   Dg Hip Unilat W Or Wo Pelvis 2-3 Views Right  Result Date: 06/21/2016 CLINICAL DATA:  Pt had unwitnessed fall last night. Right hip pain since. No prior injury. EXAM: DG HIP (WITH OR WITHOUT PELVIS) 2-3V RIGHT COMPARISON:  None. FINDINGS: Impacted subcapital fracture, right femur. No dislocation. Diffuse osteopenia. Bony pelvis intact. Question of old fracture deformity, left iliac wing. Left pelvic phleboliths. IMPRESSION: Impacted right femoral subcapital fracture. Electronically Signed   By: Corlis Leak  Hassell M.D.   On: 06/21/2016 09:18    Positive ROS: All other systems have been reviewed and were otherwise negative with the exception of those mentioned in the HPI and as above.  Physical Exam: General: Alert, no acute distress Cardiovascular: No pedal edema Respiratory: No cyanosis, no use of accessory musculature GI: No organomegaly, abdomen is soft and non-tender Skin: No lesions in the area of chief complaint Neurologic: Sensation intact distally Psychiatric: Patient is competent for consent with normal mood and affect Lymphatic: No axillary or cervical lymphadenopathy  MUSCULOSKELETAL: leg lengths are equal. Palpable DP and PT pulses. SILT in all distributions  Assessment: Right impacted femoral neck fracture  Plan: Risk stratification and medical optimization today. Will plan for percutaneous screw fixation today pending medical clearance       06/21/2016 10:35 AM

## 2016-06-21 NOTE — Op Note (Signed)
06/21/2016  5:18 PM  PATIENT:  Kelly Caldwell    PRE-OPERATIVE DIAGNOSIS:  right hip fracture  POST-OPERATIVE DIAGNOSIS:  Same  PROCEDURE:  CANNULATED HIP PINNING  SURGEON:  Thornton Papasobin Eckel, MD  PHYSICIAN ASSISTANT: None  ANESTHESIA:   Spinal  PRE-OP ANTIBIOTICS: 1 gm Ancef  PREOPERATIVE INDICATIONS:  Kelly Caldwell is a  83 y.o. female who fell and was found to have a diagnosis of right impacted femoral neck fracture who elected for surgical management.    The risks benefits and alternatives were discussed with the patient preoperatively including but not limited to the risks of infection, bleeding, nerve injury, cardiopulmonary complications, blood clots, malunion, nonunion, avascular necrosis, the need for revision surgery, the potential for conversion to hemiarthroplasty, among others, and the patient was willing to proceed.  OPERATIVE IMPLANTS: 6.5 mm cannulated screws x3, washers x3  OPERATIVE FINDINGS: Clinical osteoporosis with weak bone, proximal femur  OPERATIVE PROCEDURE: The patient was brought to the operating room and placed in supine position. IV antibiotics were given. Spinal anesthesia administered. The patient was placed on the fracture table. The operative extremity was positioned, without any  reduction maneuver and was prepped and draped in usual sterile fashion.  Time out was performed.  3 guidewires were introduced Into an inverted triangle configuration undfer fluoroscopic guidance. The lengths were measured. The reduction was slightly valgus, and near-anatomic. I opened the cortex with a cannulated drill, and then placed the screws into position. Satisfactory fixation was achieved. [place] fluoroscopy was performed to confirm no intra-articular penetration of the screws.  The wounds were irrigated copiously, and repaired with Vicryl and staples. There no complications and the patient tolerated the procedure well.  The patient will be foot flat  weightbearing for 6 weeks post-op.

## 2016-06-21 NOTE — Progress Notes (Signed)
Surgical dressing clean, dry and intact. VSS. No complaints of pain at this time. Clear liquid tray ordered, NS @ 100 ml infusing. Family at bedside.

## 2016-06-21 NOTE — ED Provider Notes (Addendum)
[facility] Emergency Department Provider Note _______________   First MD Initiated Contact with Patient 06/21/16 [phone]     (approximate)  I have reviewed the triage vital signs and the nursing notes.   HISTORY  Chief Complaint Fall    HPI Kelly Caldwell is a 83 y.o. female who reports she fell last night and hurt her right hip. She had to crawl to rooms to get the phone. She is okay when she is lying there but complains of pain in the right hip if she moves it. Family reports she fell about 2 weeks ago as well and bruise the middle of the right thigh. That area is still bruised but improving is not really tender to palpation and there is no bony tenderness in that area. Patient is not sure what happened she thinks she might have tripped. She says she did not pass out. She says she doesn't have any other pain except for the right hip.  Past Medical History:  Diagnosis Date  . Arthritis   . HLD (hyperlipidemia)   . HTN (hypertension)   . Hypothyroidism   . Urinary frequency     There are no active problems to display for this patient.   Past Surgical History:  Procedure Laterality Date  . INCONTINENCE SURGERY      Prior to Admission medications   Medication Sig Start Date End Date Taking? Authorizing Provider  ALPRAZolam Prudy Feeler) 0.25 MG tablet Take 0.25 mg by mouth at bedtime as needed for anxiety.    [provider]  AMLODIPINE BESYLATE PO Take by mouth.    [provider]  aspirin EC 81 MG tablet Take 81 mg by mouth daily.     [provider]  atorvastatin (LIPITOR) 10 MG tablet Take 10 mg by mouth daily.    [provider]  HYDROcodone-acetaminophen (NORCO/VICODIN) 5-325 MG tablet Take by mouth. 12/31/14   [provider]  levothyroxine (SYNTHROID, LEVOTHROID) 88 MCG tablet Take 88 mcg by mouth daily before breakfast.    [provider]  lisinopril (PRINIVIL,ZESTRIL) 10 MG tablet Take 10 mg  by mouth daily.    [provider]  metFORMIN (GLUCOPHAGE) 500 MG tablet Take by mouth 2 (two) times daily with a meal.    [provider]    Allergies Patient has no known allergies.  Family History  Problem Relation Age of Onset  . Kidney disease Neg Hx   . Bladder Cancer Neg Hx     Social History Social History  Substance Use Topics  . Smoking status: Never Smoker  . Smokeless tobacco: Never Used  . Alcohol use No    Review of Systems  Constitutional: No fever/chills Eyes: No visual changes. ENT: No sore throat. Cardiovascular: Denies chest pain. Respiratory: Denies shortness of breath. Gastrointestinal: No abdominal pain.  No nausea, no vomiting.  No diarrhea.  No constipation. Genitourinary: Negative for dysuria. Musculoskeletal: Negative for back pain. Skin: Negative for rash. Neurological: Negative for headaches, focal weakness or numbness.   ____________________________________________   PHYSICAL EXAM:  VITAL SIGNS: ED Triage Vitals [06/21/16 0830]  Enc Vitals Group     BP (!) 186/81     Pulse Rate 82     Resp 16     Temp 98.1 F (36.7 C)     Temp Source Oral     SpO2 (!) 89 %     Weight 100 lb (45.4 kg)     Height 5' (1.524 m)  Head Circumference      Peak Flow      Pain Score      Pain Loc      Pain Edu?      Excl. in GC?     Constitutional: Alert and oriented. Well appearing and in no acute distress. Eyes: Conjunctivae are normal. PERRL. EOMI. Head: Atraumatic. Nose: No congestion/rhinnorhea. Mouth/Throat: Mucous membranes are moist.  Oropharynx non-erythematous. Neck: No stridor.   Cardiovascular: Normal rate, regular rhythm. Grossly normal heart sounds.  Good peripheral circulation. Respiratory: Normal respiratory effort.  No retractions. Lungs CTAB. Gastrointestinal: Soft and nontender. No distention. No abdominal bruits. No CVA tenderness. Musculoskeletal: No lower extremity edema.  No joint effusions. Patient  complains of pain in the right hip area. There is a big bruise laterally over the mid thigh but this is old. There is no bony tenderness in that area. Neurologic:  Normal speech and language. No gross focal neurologic deficits are appreciated. No gait instability. Skin:  Skin is warm, dry and intact. No rash noted. Psychiatric: Mood and affect are normal. Speech and behavior are normal.  ____________________________________________   LABS (all labs ordered are listed, but only abnormal results are displayed)  Labs Reviewed  URINALYSIS, COMPLETE (UACMP) WITH MICROSCOPIC - Abnormal; Notable for the following:       Result Value   Color, Urine COLORLESS (*)    APPearance CLEAR (*)    Glucose, UA 50 (*)    All other components within normal limits  COMPREHENSIVE METABOLIC PANEL - Abnormal; Notable for the following:    Potassium 3.0 (*)    Glucose, Bld 180 (*)    All other components within normal limits  CBC WITH DIFFERENTIAL/PLATELET - Abnormal; Notable for the following:    WBC 15.3 (*)    RBC 3.79 (*)    RDW 14.7 (*)    Neutro Abs 12.4 (*)    Monocytes Absolute 1.2 (*)    All other components within normal limits  GLUCOSE, CAPILLARY - Abnormal; Notable for the following:    Glucose-Capillary 179 (*)    All other components within normal limits  TROPONIN I  CBG MONITORING, ED   ____________________________________________  EKG  EKG read and interpreted by me shows sinus rhythm at a rate of 100 left axis nonspecific ST-T wave changes ____________________________________________  RADIOLOGY  Study Result   CLINICAL DATA:  Pt had unwitnessed fall last night. Right hip pain since. No prior injury.  EXAM: DG HIP (WITH OR WITHOUT PELVIS) 2-3V RIGHT  COMPARISON:  None.  FINDINGS: Impacted subcapital fracture, right femur. No dislocation. Diffuse osteopenia. Bony pelvis intact. Question of old fracture deformity, left iliac wing. Left pelvic  phleboliths.  IMPRESSION: Impacted right femoral subcapital fracture.   Electronically Signed   By: Corlis Leak M.D.   On: 06/21/2016 09:18    Study Result   CLINICAL DATA:  Pt came to ED via EMS from home. Pt has unwitnessed fall, pt does not remember falling. Thinks she slipped out the end of her bed. Reports right hip pain. Not on blood thinners.  EXAM: CT HEAD WITHOUT CONTRAST  CT CERVICAL SPINE WITHOUT CONTRAST  TECHNIQUE: Multidetector CT imaging of the head and cervical spine was performed following the standard protocol without intravenous contrast. Multiplanar CT image reconstructions of the cervical spine were also generated.  COMPARISON:  04/24/2005  FINDINGS: CT HEAD FINDINGS  Brain: 2 cm hyperdense lesion in the right middle cranial fossa abutting the tentorium and involving the temporal lobe.  Lesion not evident on prior study from 2007. No definite dural tail. No acute intracranial hemorrhage, midline shift, or focal parenchymal edema. Acute infarct may be inapparent on noncontrast CT. Mild diffuse parenchymal atrophy. Patchy areas of hypoattenuation in deep and periventricular white matter bilaterally. Ventricles and sulci normal in size and symmetry.  Vascular: Atherosclerotic and physiologic intracranial calcifications.  Skull: Normal. Negative for fracture or focal lesion.  Sinuses/Orbits: No acute finding.  Other: None.  CT CERVICAL SPINE FINDINGS  Alignment: Normal.  Skull base and vertebrae: No acute fracture. No primary bone lesion or focal pathologic process.  Soft tissues and spinal canal: No prevertebral fluid or swelling. No visible canal hematoma. Bilateral carotid arterial calcifications.  Disc levels:  C2-3 mild facet DJD right greater than left  C3-4 mild narrowing of the interspace. Facet and uncovertebral DJD resulting in foraminal stenosis right worse than left.  C4-5 Fusion across the facet  joints bilaterally.  C5-6 moderate narrowing of the interspace with posterior protrusion.  C6-7 moderate narrowing of the interspace  Upper chest: Negative.  Other: Streak artifact from dental restorations.  IMPRESSION: 1. Negative for bleed or other acute intracranial process. 2. 2 cm mass in the right middle cranial fossa, new since 2007. Differential diagnosis includes benign meningioma, less likely intra-axial neoplasm or atypical aneurysm. Recommend elective MR head with contrast for further characterization. 3. Negative for cervical fracture or dislocation. 4. Multilevel cervical spondylitic changes as enumerated above.   Electronically Signed   By: Corlis Leak  Hassell M.D.   On: 06/21/2016 09:41    Study Result   CLINICAL DATA:  Syncope, fell  EXAM: PORTABLE CHEST - 1 VIEW  COMPARISON:  04/24/2005  FINDINGS: Large hiatal hernia, limiting evaluation of the lung bases. Mild pulmonary vascular congestion.  Stable cardiomegaly.  Atheromatous aorta.  Small right pleural effusion.  No pneumothorax.  Mild thoracic dextroscoliosis.  IMPRESSION: 1. Large hiatal hernia. 2. Small right pleural effusion. 3. No pneumothorax. 4. Central pulmonary vascular congestion, new since previous exam   Electronically Signed   By: Corlis Leak  Hassell M.D.   On: 06/21/2016 10:32    ____________________________________________   PROCEDURES  Procedure(s) performed:   Procedures  Critical Care performed:   ____________________________________________   INITIAL IMPRESSION / ASSESSMENT AND PLAN / ED COURSE  Pertinent labs & imaging results that were available during my care of the patient were reviewed by me and considered in my medical decision making (see chart for details).        ____________________________________________   FINAL CLINICAL IMPRESSION(S) / ED DIAGNOSES  Final diagnoses:  Closed fracture of right hip, initial encounter (HCC)       NEW MEDICATIONS STARTED DURING THIS VISIT:  New Prescriptions   No medications on file     Note:  This document was prepared using Dragon voice recognition software and may include unintentional dictation errors.    Arnaldo NatalMalinda, Paul F, MD 06/21/16 [id number]    Arnaldo NatalMalinda, Paul F, MD 06/21/16 1100

## 2016-06-21 NOTE — Progress Notes (Signed)
Family Meeting Note  Advance Directive:yes  Today a meeting took place with the Patient, daughter and son-in-law    The following clinical team members were present during this meeting:MD  The following were discussed:Patient's diagnosis: Management and plan of care discussed in detail with the patient and family members , Patient's progosis: Unable to determine and Goals for treatment: DNR, daughter is the healthcare power of attorney  Additional follow-up to be provided: hospitalist, ortho and cardiology   Time spent during discussion:18 min  Kelly Caldwell, Deanna ArtisAruna, MD

## 2016-06-21 NOTE — [place] FL2 (Signed)
[place] MEDICAID FL2 LEVEL OF CARE SCREENING TOOL     IDENTIFICATION  Patient Name: Kelly MusaMarguerite Charters Birthdate: 02-09-1915 Sex: female Admission Date (Current Location): 06/21/2016  [place]   Facility and Address:  [facility], [address], [place], [place] [address]      Provider Number: [id number]  Attending Physician Name and Address:  Ramonita LabGouru, Corby Villasenor, MD  Relative Name and Phone Number:  Pernell DupreWanda Grant [phone]    Current Level of Care: Hospital Recommended Level of Care: Skilled Nursing Facility Prior Approval Number:    Date Approved/Denied:   PASRR Number: [id number]  Discharge Plan:  (TBD)    Current Diagnoses: Patient Active Problem List   Diagnosis Date Noted  . Hip fracture (HCC) 06/21/2016    Orientation RESPIRATION BLADDER Height & Weight     Self, Time, Situation, Place  Normal Continent Weight: 100 lb (45.4 kg) Height:  5' (152.4 cm)  BEHAVIORAL SYMPTOMS/MOOD NEUROLOGICAL BOWEL NUTRITION STATUS      Continent Diet (Normal)  AMBULATORY STATUS COMMUNICATION OF NEEDS Skin   Limited Assist Verbally Normal                       Personal Care Assistance Level of Assistance  Bathing, Total care, Dressing, Feeding Bathing Assistance: Limited assistance Feeding assistance: Independent Dressing Assistance: Limited assistance Total Care Assistance: Limited assistance   Functional Limitations Info  Sight, Hearing, Speech Sight Info: Adequate Hearing Info: Impaired (Hard of hearing) Speech Info: Adequate    SPECIAL CARE FACTORS FREQUENCY                       Contractures Contractures Info: Not present    Additional Factors Info  Code Status Code Status Info: DNR             Current Medications (06/21/2016):  This is the current hospital active medication list Current Facility-Administered Medications  Medication Dose Route Frequency Provider Last Rate Last Dose  .  metoprolol tartrate (LOPRESSOR) injection 5 mg  5 mg Intravenous Q4H PRN Janah Mcculloh, MD      . potassium chloride SA (K-DUR,KLOR-CON) CR tablet 40 mEq  40 mEq Oral Q4H Ashley Bultema, MD       Current Outpatient Prescriptions  Medication Sig Dispense Refill  . ALPRAZolam (XANAX) 0.5 MG tablet Take 0.5 mg by mouth at bedtime.     Marland Kitchen. amLODipine (NORVASC) 2.5 MG tablet Take 1 tablet by mouth daily.     . Ascorbic Acid (VITAMIN C) 1000 MG tablet Take 1,000 mg by mouth daily.    Marland Kitchen. aspirin EC 81 MG tablet Take 81 mg by mouth at bedtime.     Marland Kitchen. atorvastatin (LIPITOR) 40 MG tablet Take 40 mg by mouth at bedtime.     . cholecalciferol (VITAMIN D) 1000 units tablet Take 4,000 Units by mouth daily.    . Coenzyme Q10 (CO Q-10) 200 MG CAPS Take 1 capsule by mouth daily.    Marland Kitchen. ibuprofen (ADVIL,MOTRIN) 200 MG tablet Take 200 mg by mouth every 6 (six) hours as needed.    Marland Kitchen. levothyroxine (SYNTHROID, LEVOTHROID) 100 MCG tablet Take 100 mcg by mouth daily before breakfast.     . lisinopril (PRINIVIL,ZESTRIL) 2.5 MG tablet Take 2.5 mg by mouth daily.     . metFORMIN (GLUCOPHAGE) 1000 MG tablet Take 1,000 mg by mouth 2 (two) times daily with a meal.     . Multiple Vitamin (MULTIVITAMIN) tablet  Take 1 tablet by mouth daily.       Discharge Medications: Please see discharge summary for a list of discharge medications.  Relevant Imaging Results:  Relevant Lab Results:   Additional Information SSN [id number]  Cheron Schaumann, [place]

## 2016-06-22 ENCOUNTER — Encounter: Payer: Self-pay | Admitting: Orthopedic Surgery

## 2016-06-22 ENCOUNTER — Encounter
Admission: RE | Admit: 2016-06-22 | Discharge: 2016-06-22 | Disposition: A | Discharge status: Home / Self Care | Payer: Medicare Other | Source: Ambulatory Visit | Attending: Internal Medicine | Admitting: Internal Medicine

## 2016-06-22 ENCOUNTER — Inpatient Hospital Stay (HOSPITAL_COMMUNITY)
Admit: 2016-06-22 | Discharge: 2016-06-22 | Disposition: A | Discharge status: Home / Self Care | Payer: Medicare Other | Attending: Internal Medicine | Admitting: Internal Medicine

## 2016-06-22 DIAGNOSIS — R509 Fever, unspecified: Secondary | ICD-10-CM | POA: Insufficient documentation

## 2016-06-22 DIAGNOSIS — I360 Nonrheumatic tricuspid (valve) stenosis: Secondary | ICD-10-CM

## 2016-06-22 DIAGNOSIS — R05 Cough: Secondary | ICD-10-CM | POA: Insufficient documentation

## 2016-06-22 DIAGNOSIS — I350 Nonrheumatic aortic (valve) stenosis: Secondary | ICD-10-CM

## 2016-06-22 DIAGNOSIS — E119 Type 2 diabetes mellitus without complications: Secondary | ICD-10-CM | POA: Insufficient documentation

## 2016-06-22 LAB — COMPREHENSIVE METABOLIC PANEL
ALK PHOS: 109 U/L (ref 38–126)
ALT: 15 U/L (ref 14–54)
ANION GAP: 8 (ref 5–15)
AST: 23 U/L (ref 15–41)
Albumin: 3.3 g/dL — ABNORMAL LOW (ref 3.5–5.0)
BUN: 8 mg/dL (ref 6–20)
CALCIUM: 8.8 mg/dL — AB (ref 8.9–10.3)
CHLORIDE: 103 mmol/L (ref 101–111)
CO2: 28 mmol/L (ref 22–32)
Creatinine, Ser: 0.75 mg/dL (ref 0.44–1.00)
GFR calc non Af Amer: >60 mL/min (ref >60)
Glucose, Bld: 163 mg/dL — ABNORMAL HIGH (ref 65–99)
Potassium: 3.4 mmol/L — ABNORMAL LOW (ref 3.5–5.1)
SODIUM: 139 mmol/L (ref 135–145)
Total Bilirubin: 0.9 mg/dL (ref 0.3–1.2)
Total Protein: 6.8 g/dL (ref 6.5–8.1)

## 2016-06-22 LAB — URINALYSIS, COMPLETE (UACMP) WITH MICROSCOPIC
BACTERIA UA: NONE SEEN
Bilirubin Urine: NEGATIVE
GLUCOSE, UA: NEGATIVE mg/dL
KETONES UR: 80 mg/dL — AB
Nitrite: NEGATIVE
PROTEIN: 100 mg/dL — AB
Specific Gravity, Urine: 1.02 (ref 1.005–1.030)
pH: 6 (ref 5.0–8.0)

## 2016-06-22 LAB — CBC
HEMATOCRIT: 36.4 % (ref 35.0–47.0)
HEMOGLOBIN: 12 g/dL (ref 12.0–16.0)
MCH: 32.9 pg (ref 26.0–34.0)
MCHC: 33 g/dL (ref 32.0–36.0)
MCV: 99.8 fL (ref 80.0–100.0)
Platelets: 216 10*3/uL (ref 150–440)
RBC: 3.65 MIL/uL — AB (ref 3.80–5.20)
RDW: 15 % — ABNORMAL HIGH (ref 11.5–14.5)
WBC: 15.6 10*3/uL — ABNORMAL HIGH (ref 3.6–11.0)

## 2016-06-22 LAB — ECHOCARDIOGRAM COMPLETE
Height: 60 in
WEIGHTICAEL: 1600 [oz_av]

## 2016-06-22 LAB — GLUCOSE, CAPILLARY
GLUCOSE-CAPILLARY: 184 mg/dL — AB (ref 65–99)
GLUCOSE-CAPILLARY: 121 mg/dL — AB (ref 65–99)
Glucose-Capillary: 152 mg/dL — ABNORMAL HIGH (ref 65–99)
Glucose-Capillary: 138 mg/dL — ABNORMAL HIGH (ref 65–99)

## 2016-06-22 LAB — LIPID PANEL
CHOL/HDL RATIO: 1.8 ratio
Cholesterol: 130 mg/dL (ref 0–200)
HDL: 71 mg/dL (ref >40)
LDL CALC: 47 mg/dL (ref 0–99)
Triglycerides: 59 mg/dL (ref <150)
VLDL: 12 mg/dL (ref 0–40)

## 2016-06-22 LAB — TSH: TSH: 0.985 u[IU]/mL (ref 0.350–4.500)

## 2016-06-22 MED ORDER — DEXTROSE 5 % IV SOLN
1.0000 g | INTRAVENOUS | Status: DC
  Administered 2016-06-22 – 2016-06-23 (×2): 1 g via INTRAVENOUS
  Filled 2016-06-22 (×2): qty 10

## 2016-06-22 MED ORDER — POTASSIUM CHLORIDE CRYS ER 20 MEQ PO TBCR
40.0000 meq | EXTENDED_RELEASE_TABLET | Freq: Once | ORAL | Status: AC
  Administered 2016-06-22: 40 meq via ORAL
  Filled 2016-06-22: qty 2

## 2016-06-22 NOTE — Progress Notes (Signed)
  Subjective: 1 Day Post-Op Procedure(s) (LRB): CANNULATED HIP PINNING (Right) Patient reports pain as moderate.   Patient is well, and has had no acute complaints or problems PT and Care management to assist with discharge. Negative for chest pain and shortness of breath Fever: no Gastrointestinal:Negative for nausea and vomiting  Objective: Vital signs in last 24 hours: Temp:  [97.2 F (36.2 C)-98.7 F (37.1 C)] 98.2 F (36.8 C) (05/21 0346) Pulse Rate:  [68-88] 88 (05/21 0346) Resp:  [16-23] 16 (05/21 0346) BP: (111-186)/(54-85) 172/62 (05/21 0346) SpO2:  [78 %-98 %] 92 % (05/21 0346) FiO2 (%):  [2 %] 2 % (05/20 1930) Weight:  [45.4 kg (100 lb)] 45.4 kg (100 lb) (05/20 0830)  Intake/Output from previous day:  Intake/Output Summary (Last 24 hours) at 06/22/16 0739 Last data filed at 06/21/16 2018  Gross per 24 hour  Intake              500 ml  Output              225 ml  Net              275 ml    Intake/Output this shift: No intake/output data recorded.  Labs:  Recent Labs  06/21/16 0835 06/22/16 0425  HGB 12.6 12.0    Recent Labs  06/21/16 0835 06/22/16 0425  WBC 15.3* 15.6*  RBC 3.79* 3.65*  HCT 37.5 36.4  PLT 235 216    Recent Labs  06/21/16 0835 06/22/16 0425  NA 140 139  K 3.0* 3.4*  CL 102 103  CO2 30 28  BUN 12 8  CREATININE 0.56 0.75  GLUCOSE 180* 163*  CALCIUM 9.4 8.8*   No results for input(s): LABPT, INR in the last 72 hours.   EXAM General - Patient is Alert and Appropriate Extremity - ABD soft Sensation intact distally Intact pulses distally Dorsiflexion/Plantar flexion intact Incision: dressing C/D/I No cellulitis present Dressing/Incision - clean, dry, no drainage Motor Function - intact, moving foot and toes well on exam.  Abdomen soft without tympany.  Normal BS.  Past Medical History:  Diagnosis Date  . Arthritis   . HLD (hyperlipidemia)   . HTN (hypertension)   . Hypothyroidism   . Urinary frequency      Assessment/Plan: 1 Day Post-Op Procedure(s) (LRB): CANNULATED HIP PINNING (Right) Active Problems:   Hip fracture (HCC)  Estimated body mass index is 19.53 kg/m as calculated from the following:   Height as of this encounter: 5' (1.524 m).   Weight as of this encounter: 45.4 kg (100 lb). Advance diet Up with therapy D/C IV fluids when tolerating po intake.  Labs reviewed, WBC 15.6, no fevers or tachycardia.  Will obtain UA. K+ 3.4, will supplement. Begin working on having a BM. Up with therapy today, will most likely need SNF placement. CBC and BMP ordered for tomorrow morning.  DVT Prophylaxis - Lovenox, Foot Pumps and TED hose Partial weightbearing to the right lower extremity.  Valeria BatmanJ. Lance Marykatherine Sherwood, PA-C [facility] 06/22/2016, 7:39 AM

## 2016-06-22 NOTE — Progress Notes (Signed)
*  PRELIMINARY RESULTS* Echocardiogram 2D Echocardiogram has been performed.  Kelly Caldwell, Kelly Caldwell 06/22/2016, 10:08 AM

## 2016-06-22 NOTE — Clinical Social Work Placement (Signed)
   CLINICAL SOCIAL WORK PLACEMENT  NOTE  Date:  06/22/2016  Patient Details  Name: Ileene MusaMarguerite Patton MRN: [id number] Date of Birth: [birth year]  Clinical Social Work is seeking post-discharge placement for this patient at the Skilled  Nursing Facility level of care (*CSW will initial, date and re-position this form in  chart as items are completed):  Yes   Patient/family provided with [place] Clinical Social Work Department's list of facilities offering this level of care within the geographic area requested by the patient (or if unable, by the patient's family).  Yes   Patient/family informed of their freedom to choose among providers that offer the needed level of care, that participate in Medicare, Medicaid or managed care program needed by the patient, have an available bed and are willing to accept the patient.  Yes   Patient/family informed of [facility] ownership interest in [facility] and [facility], as well as of the fact that they are under no obligation to receive care at these facilities.  PASRR submitted to EDS on 06/21/16     PASRR number received on 06/21/16     Existing PASRR number confirmed on       FL2 transmitted to all facilities in geographic area requested by pt/family on 06/22/16     FL2 transmitted to all facilities within larger geographic area on       Patient informed that his/her managed care company has contracts with or will negotiate with certain facilities, including the following:        Yes   Patient/family informed of bed offers received.  Patient chooses bed at  [facility]     Physician recommends and patient chooses bed at      Patient to be transferred to   on  .  Patient to be transferred to facility by       Patient family notified on   of transfer.  Name of family member notified:        PHYSICIAN       Additional Comment:    _______________________________________________ Stpehen Petitjean, Darleen CrockerBailey M,  LCSW 06/22/2016, 2:48 PM

## 2016-06-22 NOTE — Progress Notes (Addendum)
[facility] [place] [facility]   PATIENT NAME: Kelly Caldwell    MR#:  [id number]  DATE OF BIRTH:  [birth year]  SUBJECTIVE:   Patient had some nausea this morning. She received antiemetic. Family is at bedside.  REVIEW OF SYSTEMS:    Review of Systems  Constitutional: Negative for fever, chills weight loss HENT: Negative for ear pain, nosebleeds, congestion, facial swelling, rhinorrhea, neck pain, neck stiffness and ear discharge.   Respiratory: Negative for cough, shortness of breath, wheezing  Cardiovascular: Negative for chest pain, palpitations and leg swelling.  Gastrointestinal: Negative for heartburn, abdominal pain, vomiting, diarrhea or consitpation Genitourinary: Negative for dysuria, urgency, frequency, hematuria Musculoskeletal: Negative for back pain  Mild hip pain Neurological: Negative for dizziness, seizures, syncope, focal weakness,  numbness and headaches.  Hematological: Does not bruise/bleed easily.  Psychiatric/Behavioral: Negative for hallucinations, confusion, dysphoric mood    Tolerating Diet: yes      DRUG ALLERGIES:  No Known Allergies  VITALS:  Blood pressure (!) 158/76, pulse 87, temperature 98.5 F (36.9 C), temperature source Oral, resp. rate 16, height 5' (1.524 m), weight 45.4 kg (100 lb), SpO2 97 %.  PHYSICAL EXAMINATION:  Constitutional: Appears well-developed and well-nourished. No distress.frail thin HENT: Normocephalic. Marland Kitchen Oropharynx is clear and moist.  Eyes: Conjunctivae and EOM are normal. PERRLA, no scleral icterus.  Neck: Normal ROM. Neck supple. No JVD. No tracheal deviation. CVS: RRR, S1/S2 +, no murmurs, no gallops, no carotid bruit.  Pulmonary: Effort and breath sounds normal, no stridor, rhonchi, wheezes, rales.  Abdominal: Soft. BS +,  no distension, tenderness, rebound or guarding.  Musculoskeletal: Decreased range of motion of hip due to recent surgery No edema and no tenderness.  Neuro: A bit  sleepy (she just received something for nausea) No focal deficits. Skin: Skin is warm and dry. No rash noted. Psychiatric: A bit sleepy      LABORATORY PANEL:   CBC  Recent Labs Lab 06/22/16 0425  WBC 15.6*  HGB 12.0  HCT 36.4  PLT 216   ------------------------------------------------------------------------------------------------------------------  Chemistries   Recent Labs Lab 06/22/16 0425  NA 139  K 3.4*  CL 103  CO2 28  GLUCOSE 163*  BUN 8  CREATININE 0.75  CALCIUM 8.8*  AST 23  ALT 15  ALKPHOS 109  BILITOT 0.9   ------------------------------------------------------------------------------------------------------------------  Cardiac Enzymes  Recent Labs Lab 06/21/16 0835  TROPONINI <0.03   ------------------------------------------------------------------------------------------------------------------  RADIOLOGY:  Ct Head Wo Contrast  Result Date: 06/21/2016 CLINICAL DATA:  Pt came to ED via EMS from home. Pt has unwitnessed fall, pt does not remember falling. Thinks she slipped out the end of her bed. Reports right hip pain. Not on blood thinners. EXAM: CT HEAD WITHOUT CONTRAST CT CERVICAL SPINE WITHOUT CONTRAST TECHNIQUE: Multidetector CT imaging of the head and cervical spine was performed following the standard protocol without intravenous contrast. Multiplanar CT image reconstructions of the cervical spine were also generated. COMPARISON:  04/24/2005 FINDINGS: CT HEAD FINDINGS Brain: 2 cm hyperdense lesion in the right middle cranial fossa abutting the tentorium and involving the temporal lobe. Lesion not evident on prior study from 2007. No definite dural tail. No acute intracranial hemorrhage, midline shift, or focal parenchymal edema. Acute infarct may be inapparent on noncontrast CT. Mild diffuse parenchymal atrophy. Patchy areas of hypoattenuation in deep and periventricular white matter bilaterally. Ventricles and sulci normal in size and  symmetry. Vascular: Atherosclerotic and physiologic intracranial calcifications. Skull: Normal. Negative for fracture or focal lesion. Sinuses/Orbits: No  acute finding. Other: None. CT CERVICAL SPINE FINDINGS Alignment: Normal. Skull base and vertebrae: No acute fracture. No primary bone lesion or focal pathologic process. Soft tissues and spinal canal: No prevertebral fluid or swelling. No visible canal hematoma. Bilateral carotid arterial calcifications. Disc levels: C2-3 mild facet DJD right greater than left C3-4 mild narrowing of the interspace. Facet and uncovertebral DJD resulting in foraminal stenosis right worse than left. C4-5 Fusion across the facet joints bilaterally. C5-6 moderate narrowing of the interspace with posterior protrusion. C6-7 moderate narrowing of the interspace Upper chest: Negative. Other: Streak artifact from dental restorations. IMPRESSION: 1. Negative for bleed or other acute intracranial process. 2. 2 cm mass in the right middle cranial fossa, new since 2007. Differential diagnosis includes benign meningioma, less likely intra-axial neoplasm or atypical aneurysm. Recommend elective MR head with contrast for further characterization. 3. Negative for cervical fracture or dislocation. 4. Multilevel cervical spondylitic changes as enumerated above. Electronically Signed   By: Corlis Leak M.D.   On: 06/21/2016 09:41   Ct Cervical Spine Wo Contrast  Result Date: 06/21/2016 CLINICAL DATA:  Pt came to ED via EMS from home. Pt has unwitnessed fall, pt does not remember falling. Thinks she slipped out the end of her bed. Reports right hip pain. Not on blood thinners. EXAM: CT HEAD WITHOUT CONTRAST CT CERVICAL SPINE WITHOUT CONTRAST TECHNIQUE: Multidetector CT imaging of the head and cervical spine was performed following the standard protocol without intravenous contrast. Multiplanar CT image reconstructions of the cervical spine were also generated. COMPARISON:  04/24/2005 FINDINGS: CT  HEAD FINDINGS Brain: 2 cm hyperdense lesion in the right middle cranial fossa abutting the tentorium and involving the temporal lobe. Lesion not evident on prior study from 2007. No definite dural tail. No acute intracranial hemorrhage, midline shift, or focal parenchymal edema. Acute infarct may be inapparent on noncontrast CT. Mild diffuse parenchymal atrophy. Patchy areas of hypoattenuation in deep and periventricular white matter bilaterally. Ventricles and sulci normal in size and symmetry. Vascular: Atherosclerotic and physiologic intracranial calcifications. Skull: Normal. Negative for fracture or focal lesion. Sinuses/Orbits: No acute finding. Other: None. CT CERVICAL SPINE FINDINGS Alignment: Normal. Skull base and vertebrae: No acute fracture. No primary bone lesion or focal pathologic process. Soft tissues and spinal canal: No prevertebral fluid or swelling. No visible canal hematoma. Bilateral carotid arterial calcifications. Disc levels: C2-3 mild facet DJD right greater than left C3-4 mild narrowing of the interspace. Facet and uncovertebral DJD resulting in foraminal stenosis right worse than left. C4-5 Fusion across the facet joints bilaterally. C5-6 moderate narrowing of the interspace with posterior protrusion. C6-7 moderate narrowing of the interspace Upper chest: Negative. Other: Streak artifact from dental restorations. IMPRESSION: 1. Negative for bleed or other acute intracranial process. 2. 2 cm mass in the right middle cranial fossa, new since 2007. Differential diagnosis includes benign meningioma, less likely intra-axial neoplasm or atypical aneurysm. Recommend elective MR head with contrast for further characterization. 3. Negative for cervical fracture or dislocation. 4. Multilevel cervical spondylitic changes as enumerated above. Electronically Signed   By: Corlis Leak M.D.   On: 06/21/2016 09:41   Dg Chest Portable 1 View  Result Date: 06/21/2016 CLINICAL DATA:  Syncope, fell EXAM:  PORTABLE CHEST - 1 VIEW COMPARISON:  04/24/2005 FINDINGS: Large hiatal hernia, limiting evaluation of the lung bases. Mild pulmonary vascular congestion. Stable cardiomegaly.  Atheromatous aorta. Small right pleural effusion.  No pneumothorax. Mild thoracic dextroscoliosis. IMPRESSION: 1. Large hiatal hernia. 2. Small right pleural effusion. 3.  No pneumothorax. 4. Central pulmonary vascular congestion, new since previous exam Electronically Signed   By: Corlis Leak  Hassell M.D.   On: 06/21/2016 10:32   Dg Hip Operative Unilat W Or W/o Pelvis Right  Result Date: 06/21/2016 CLINICAL DATA:  Intraoperative repair hip fracture. EXAM: OPERATIVE RIGHT HIP (WITH PELVIS IF PERFORMED) 2 VIEWS TECHNIQUE: Fluoroscopic spot image(s) were submitted for interpretation post-operatively. COMPARISON:  None. FINDINGS: Three screws now cross the right hip fracture. IMPRESSION: Right hip fracture repair as above. Electronically Signed   By: Gerome Samavid  Williams III M.D   On: 06/21/2016 17:22   Dg Hip Unilat W Or Wo Pelvis 2-3 Views Right  Result Date: 06/21/2016 CLINICAL DATA:  Pt had unwitnessed fall last night. Right hip pain since. No prior injury. EXAM: DG HIP (WITH OR WITHOUT PELVIS) 2-3V RIGHT COMPARISON:  None. FINDINGS: Impacted subcapital fracture, right femur. No dislocation. Diffuse osteopenia. Bony pelvis intact. Question of old fracture deformity, left iliac wing. Left pelvic phleboliths. IMPRESSION: Impacted right femoral subcapital fracture. Electronically Signed   By: Corlis Leak  Hassell M.D.   On: 06/21/2016 09:18     ASSESSMENT AND PLAN:   83 year old female with history of diabetes and hypertension who presents after mechanical fall and suffered a right hip fracture.  1.Right impacted femoral neck fracture: Postoperative day #1 CANNULATED HIP PINNING Appreciate orthopedic surgery consultation DVT prophylaxis with Lovenox  2. Essential hypertension: Continue lisinopril and Norvasc  3. Diabetes: Continue sliding scale  with ADA diet  4. Hypothyroid: Continue Synthroid  5. Hypokalemia, mild: Replete and recheck in a.m.   6.UTI Start Rocephin and follow up on Urine Cx    Management plans discussed with the patient and daughter and she is in agreement.  CODE STATUS: DNR  TOTAL TIME TAKING CARE OF THIS PATIENT: 29 minutes.     POSSIBLE D/C 1-2 days, DEPENDING ON CLINICAL CONDITION.   Amillion Macchia M.D on 06/22/2016 at 10:54 AM  Between 7am to 6pm - Pager - [phone] After 6pm go to [email or web address] - password [employer] [email or web address]  [facility]  Office  [phone]  CC: Primary care physician; Corky DownsMasoud, Javed, MD  Note: This dictation was prepared with Dragon dictation along with smaller phrase technology. Any transcriptional errors that result from this process are unintentional.

## 2016-06-22 NOTE — Progress Notes (Signed)
ANTIBIOTIC CONSULT NOTE - INITIAL  Pharmacy Consult for Rocephin Indication: urinary tract infection   No Known Allergies  Patient Measurements: Height: 5' (152.4 cm) Weight: 100 lb (45.4 kg) IBW/kg (Calculated) : 45.5 Adjusted Body Weight:   Vital Signs: Temp: 98.5 F (36.9 C) (05/21 0742) Temp Source: Oral (05/21 0742) BP: 158/76 (05/21 0742) Pulse Rate: 87 (05/21 0742) Intake/Output from previous day: 05/20 0701 - 05/21 0700 In: 500 [I.V.:500] Out: 225 [Urine:200; Blood:25] Intake/Output from this shift: No intake/output data recorded.  Labs:  Recent Labs  06/21/16 0835 06/22/16 0425  WBC 15.3* 15.6*  HGB 12.6 12.0  PLT 235 216  CREATININE 0.56 0.75   Estimated Creatinine Clearance: 27.5 mL/min (by C-G formula based on SCr of 0.75 mg/dL). No results for input(s): VANCOTROUGH, VANCOPEAK, VANCORANDOM, GENTTROUGH, GENTPEAK, GENTRANDOM, TOBRATROUGH, TOBRAPEAK, TOBRARND, AMIKACINPEAK, AMIKACINTROU, AMIKACIN in the last 72 hours.   Microbiology: Recent Results (from the past 720 hour(s))  Surgical PCR screen     Status: None   Collection Time: 06/21/16  1:33 PM  Result Value Ref Range Status   MRSA, PCR NEGATIVE NEGATIVE Final   Staphylococcus aureus NEGATIVE NEGATIVE Final    Comment:        The Xpert SA Assay (FDA approved for NASAL specimens in patients over 81 years of age), is one component of a comprehensive surveillance program.  Test performance has been validated by [facility] for patients greater than or equal to 81 year old. It is not intended to diagnose infection nor to guide or monitor treatment.     Medical History: Past Medical History:  Diagnosis Date  . Arthritis   . HLD (hyperlipidemia)   . HTN (hypertension)   . Hypothyroidism   . Urinary frequency     Medications:   Assessment:Pharmacy to dose and monitor rocephin in this 83 year old woman admitted for femoral neck fracture    Goal of Therapy:    Plan: Will start  Rocephin 1 g IV q24 hours.   Damin Salido D 06/22/2016,11:27 AM

## 2016-06-22 NOTE — Plan of Care (Signed)
Problem: Education: Goal: Knowledge of treatment and prevention of UTI/Pyleonephritis will improve Outcome: Progressing IV abx started, pt and family educated

## 2016-06-22 NOTE — Evaluation (Addendum)
Physical Therapy Evaluation Patient Details Name: Kelly Caldwell MRN: [id number] DOB: 08/24/1915 Today's Date: 06/22/2016   History of Present Illness  presented to ER secondary to fall with R hip pain; admitted with impacted R femoral neck fracture, status post cannulated hip pinning 06/21/16, PWB R LE.  Clinical Impression  Upon evaluation, patient alert and oriented to basic information; follows simple commands, though generally HOH.  R LE with fair post-op strength and ROM, guarded due to pain.  Currently requiring mod assist for bed mobility; mod assist +2 for sit/stand, basic transfers and short-distance gait (3-4 steps) with RW.  Constant hands-on assist for standing balance and PWB R LE; high fall risk without +1 physical assist at all times.  As result, unsafe to return home alone at this time. Would benefit from skilled PT to address above deficits and promote optimal return to PLOF; recommend transition to STR upon discharge from acute hospitalization.  Of note, patient unable to maintain sats on RA; anticipate possible need for supplemental O2 upon discharge. SaO2 on room air at rest = 85% SaO2 on 2L at rest = 93%       Follow Up Recommendations SNF    Equipment Recommendations  Rolling walker with 5" wheels    Recommendations for Other Services       Precautions / Restrictions Precautions Precautions: Fall Restrictions Weight Bearing Restrictions: Yes RLE Weight Bearing: Partial weight bearing      Mobility  Bed Mobility Overal bed mobility: Needs Assistance Bed Mobility: Supine to Sit     Supine to sit: Mod assist        Transfers Overall transfer level: Needs assistance Equipment used: Rolling walker (2 wheeled) Transfers: Sit to/from Stand Sit to Stand: Mod assist;+2 physical assistance         General transfer comment: cuing for hand placement; assist for lift off, static standing balance and PWB R LE  Ambulation/Gait Ambulation/Gait  assistance: Mod assist;+2 physical assistance Ambulation Distance (Feet): 4 Feet Assistive device: Rolling walker (2 wheeled)       General Gait Details: step to gait pattern; constant cuing for walker position/management, gait sequence and PWB R LE.  Poor standing balance; high risk for recurrent falls without constant physical assist  Stairs            Wheelchair Mobility    Modified Rankin (Stroke Patients Only)       Balance Overall balance assessment: Needs assistance Sitting-balance support: No upper extremity supported;Feet supported Sitting balance-Leahy Scale: Good     Standing balance support: Bilateral upper extremity supported Standing balance-Leahy Scale: Poor                               Pertinent Vitals/Pain Pain Assessment: Faces Faces Pain Scale: Hurts little more Pain Location: R hip Pain Descriptors / Indicators: Aching;Guarding;Grimacing Pain Intervention(s): Limited activity within patient's tolerance;Monitored during session;Repositioned;Premedicated before session    Home Living Family/patient expects to be discharged to:: Private residence Living Arrangements: Alone Available Help at Discharge: Family;Available PRN/intermittently Type of Home: House Home Access: Stairs to enter   [employer] Corporation[employer]trance Stairs-Number of Steps: 3 Home Layout: One level Home Equipment: Walker - 2 wheels      Prior Function Level of Independence: Independent         Comments: Mod indep for ADLs, household mobility without assist device; daughter lives across the road and provides daily check in     Hand Dominance  Dominant Hand: Right    Extremity/Trunk Assessment   Upper Extremity Assessment Upper Extremity Assessment: Overall WFL for tasks assessed    Lower Extremity Assessment Lower Extremity Assessment:  (R LE grossly 3-/5, limited by pain; otherwise, grossly WFL)       Communication   Communication: HOH  Cognition  Arousal/Alertness: Awake/alert Behavior During Therapy: WFL for tasks assessed/performed Overall Cognitive Status: Within Functional Limits for tasks assessed                                 General Comments: mild delay in processing and response time      General Comments      Exercises Other Exercises Other Exercises: Supine LE therex, 1x5-7, act assist ROM: ankle pumps, heel slides, hip abduct/adduct.  Constant cuing for technique, movement throughout pain-free ROM; guarded to R hip in all planes   Assessment/Plan    PT Assessment Patient needs continued PT services  PT Problem List Decreased strength;Decreased range of motion;Decreased balance;Decreased activity tolerance;Decreased mobility;Decreased coordination;Decreased cognition;Decreased knowledge of use of DME;Decreased safety awareness;Decreased knowledge of precautions;Decreased skin integrity;Pain       PT Treatment Interventions DME instruction;Gait training;Stair training;Functional mobility training;Therapeutic activities;Therapeutic exercise;Balance training;Patient/family education    PT Goals (Current goals can be found in the Care Plan section)  Acute Rehab PT Goals Patient Stated Goal: to return home PT Goal Formulation: With patient/family Time For Goal Achievement: 07/06/16 Potential to Achieve Goals: Good    Frequency BID   Barriers to discharge Decreased caregiver support      Co-evaluation               AM-PAC PT "6 Clicks" Daily Activity  Outcome Measure Difficulty turning over in bed (including adjusting bedclothes, sheets and blankets)?: Total Difficulty moving from lying on back to sitting on the side of the bed? : Total Difficulty sitting down on and standing up from a chair with arms (e.g., wheelchair, bedside commode, etc,.)?: Total Help needed moving to and from a bed to chair (including a wheelchair)?: A Lot Help needed walking in hospital room?: A Lot Help needed  climbing 3-5 steps with a railing? : A Lot 6 Click Score: 9    End of Session Equipment Utilized During Treatment: Gait belt;Oxygen Activity Tolerance: Patient tolerated treatment well Patient left: in chair;with call bell/phone within reach;with chair alarm set;with family/visitor present Nurse Communication: Mobility status PT Visit Diagnosis: Muscle weakness (generalized) (M62.81);Pain Pain - Right/Left: Right Pain - part of body: Hip    Time: 1610-9604 PT Time Calculation (min) (ACUTE ONLY): 27 min   Charges:   PT Evaluation $PT Eval Low Complexity: 1 Procedure PT Treatments $Therapeutic Exercise: 8-22 mins   PT G Codes:        Kewana Sanon H. Manson Passey, PT, DPT, NCS 06/22/16, 3:38 PM [phone]

## 2016-06-22 NOTE — Progress Notes (Signed)
Pts urine results back, MD Mody notified.  MD placing orders

## 2016-06-22 NOTE — Anesthesia Postprocedure Evaluation (Signed)
Anesthesia Post Note  Patient: Kelly Caldwell  Procedure(s) Performed: Procedure(s) (LRB): CANNULATED HIP PINNING (Right)  Patient location during evaluation: Nursing Unit Anesthesia Type: Spinal Level of consciousness: awake and alert, patient cooperative and confused Pain management: pain level controlled Vital Signs Assessment: post-procedure vital signs reviewed and stable Respiratory status: spontaneous breathing and respiratory function stable Cardiovascular status: stable Postop Assessment: no signs of nausea or vomiting, adequate PO intake and patient able to bend at knees Anesthetic complications: no     Last Vitals:  Vitals:   06/22/16 0023 06/22/16 0346  BP: 122/62 (!) 172/62  Pulse: 77 88  Resp: 18 16  Temp: 37.1 C 36.8 C    Last Pain:  Vitals:   06/22/16 0346  TempSrc: Oral  PainSc:                  Zachary GeorgeWeatherly,  Appollonia Klee F

## 2016-06-22 NOTE — Progress Notes (Addendum)
Clinical Child psychotherapistocial Worker (CSW) presented bed offers to patient and her daughter Burna MortimerWanda. [facility]. CSW will start [facility] once PT note is available. CSW will continue to follow and assist as needed.   CSW started [employer] auth.   [employer]Bailey [place], LCSW [phone]

## 2016-06-22 NOTE — Progress Notes (Signed)
PT Cancellation Note  Patient Details Name: Kelly MusaMarguerite Pyeatt MRN: [id number] DOB: 1915/02/25   Cancelled Treatment:    Reason Eval/Treat Not Completed: Other (comment) (Consult received and chart reviewed.  Patient sleeping soundly; family member (daughter) at bedside states patient just received something for nausea and requests therapist allow patient to sleep and re-attempt in PM.  will continue efforts as appropriat)   Ayeza Therriault H. Manson PasseyBrown, PT, DPT, NCS 06/22/16, 10:41 AM [phone]

## 2016-06-23 LAB — BASIC METABOLIC PANEL
ANION GAP: 10 (ref 5–15)
BUN: 13 mg/dL (ref 6–20)
CALCIUM: 9.3 mg/dL (ref 8.9–10.3)
CO2: 26 mmol/L (ref 22–32)
Chloride: 103 mmol/L (ref 101–111)
Creatinine, Ser: 0.62 mg/dL (ref 0.44–1.00)
GFR calc Af Amer: >60 mL/min (ref >60)
GLUCOSE: 151 mg/dL — AB (ref 65–99)
Potassium: 3.9 mmol/L (ref 3.5–5.1)
SODIUM: 139 mmol/L (ref 135–145)

## 2016-06-23 LAB — HEMOGLOBIN A1C
Hgb A1c MFr Bld: 8.1 % — ABNORMAL HIGH (ref 4.8–5.6)
Mean Plasma Glucose: 186 mg/dL

## 2016-06-23 LAB — GLUCOSE, CAPILLARY
Glucose-Capillary: 138 mg/dL — ABNORMAL HIGH (ref 65–99)
Glucose-Capillary: 181 mg/dL — ABNORMAL HIGH (ref 65–99)

## 2016-06-23 LAB — URINE CULTURE

## 2016-06-23 MED ORDER — DOCUSATE SODIUM 100 MG PO CAPS: 100.0000 mg | ORAL_CAPSULE | Freq: Every day | ORAL | 0 refills | Status: AC

## 2016-06-23 MED ORDER — CEPHALEXIN 500 MG PO CAPS: 500.0000 mg | ORAL_CAPSULE | Freq: Two times a day (BID) | ORAL | Status: AC

## 2016-06-23 MED ORDER — TRAMADOL HCL 50 MG PO TABS: 50.0000 mg | ORAL_TABLET | Freq: Four times a day (QID) | ORAL | 0 refills | Status: AC | PRN

## 2016-06-23 MED ORDER — ACETAMINOPHEN 650 MG RE SUPP: 650.0000 mg | Freq: Four times a day (QID) | RECTAL | 0 refills | Status: AC | PRN

## 2016-06-23 MED ORDER — ALPRAZOLAM 0.5 MG PO TABS: 0.5000 mg | ORAL_TABLET | Freq: Every day | ORAL | 0 refills | Status: AC

## 2016-06-23 MED ORDER — ENOXAPARIN SODIUM 30 MG/0.3ML ~~LOC~~ SOLN: 30.0000 mg | SUBCUTANEOUS | Status: AC

## 2016-06-23 NOTE — Progress Notes (Signed)
Upon assessment this AM, pt was disoriented to place, time and situation. Pt was yelling out for "[place]". Pt required freq cueing to follow commands. Pt, now with family in room, is alert and oriented to self, place and situation. Pt remains disoriented to time. Pt no longer yelling out. Pt now following commands with minimal cues. Pt orientation better with family in room. Cardiac tele d/ced per MD order.

## 2016-06-23 NOTE — [place] FL2 (Signed)
Kelly Caldwell MEDICAID FL2 LEVEL OF CARE SCREENING TOOL     IDENTIFICATION  Patient Name: Kelly Caldwell Birthdate: 1915/10/11 Sex: female Admission Date (Current Location): 06/21/2016  [place]   Facility and Address:  [facility], [address], [place], [place] [address]      Provider Number: [id number]  Attending Physician Name and Address:  Kelly Caldwell, Kelly Critz, Caldwell  Relative Name and Phone Number:  Kelly Caldwell [phone]    Current Level of Care: Hospital Recommended Level of Care: Skilled Nursing Facility Prior Approval Number:    Date Approved/Denied:   PASRR Number: [id number]  Discharge Plan: SNF     Current Diagnoses: Patient Active Problem List   Diagnosis Date Noted  . Hip fracture (HCC) 06/21/2016    Orientation RESPIRATION BLADDER Height & Weight     Self, Time, Situation, Place  Normal Continent Weight: 100 lb (45.4 kg) Height:  5' (152.4 cm)  BEHAVIORAL SYMPTOMS/MOOD NEUROLOGICAL BOWEL NUTRITION STATUS      Continent Diet (Normal)  AMBULATORY STATUS COMMUNICATION OF NEEDS Skin   Extensive Assist  Verbally Normal                       Personal Care Assistance Level of Assistance  Bathing, Total care, Dressing, Feeding Bathing Assistance: Limited assistance Feeding assistance: Independent Dressing Assistance: Limited assistance Total Care Assistance: Limited assistance   Functional Limitations Info  Sight, Hearing, Speech Sight Info: Adequate Hearing Info: Impaired (Hard of hearing) Speech Info: Adequate    SPECIAL CARE FACTORS FREQUENCY   PT and OT     5 times per week                 Contractures Contractures Info: Not present    Additional Factors Info  Code Status Code Status Info: DNR             Current Medications (06/23/2016):  This is the current hospital active medication list Current Facility-Administered Medications  Medication Dose Route Frequency Provider  Last Rate Last Dose  . acetaminophen (TYLENOL) tablet 325 mg  325 mg Oral Q6H PRN Kelly Caldwell, Aruna, Caldwell   325 mg at 06/22/16 1214   Or  . acetaminophen (TYLENOL) suppository 650 mg  650 mg Rectal Q6H PRN Kelly Caldwell, Aruna, Caldwell      . ALPRAZolam Prudy Feeler(XANAX) tablet 0.5 mg  0.5 mg Oral QHS Kelly Caldwell, Aruna, Caldwell   0.5 mg at 06/22/16 2116  . amLODipine (NORVASC) tablet 2.5 mg  2.5 mg Oral Daily Kelly Caldwell, Aruna, Caldwell   2.5 mg at 06/23/16 0919  . atorvastatin (LIPITOR) tablet 40 mg  40 mg Oral QHS Kelly Caldwell, Kelly ArtisAruna, Caldwell   40 mg at 06/22/16 2116  . cefTRIAXone (ROCEPHIN) 1 g in dextrose 5 % 50 mL IVPB  1 g Intravenous Q24H Kelly Caldwell, Sital, Caldwell   Stopped at 06/22/16 1246  . docusate sodium (COLACE) capsule 100 mg  100 mg Oral Daily Kelly Caldwell, Aruna, Caldwell   100 mg at 06/23/16 1000  . enoxaparin (LOVENOX) injection 30 mg  30 mg Subcutaneous Q24H Kelly Caldwell, Kelly Caldwell, Caldwell   30 mg at 06/23/16 [id number]  . insulin aspart (novoLOG) injection 0-5 Units  0-5 Units Subcutaneous QHS Kelly Caldwell, Aruna, Caldwell   2 Units at 06/21/16 2212  . insulin aspart (novoLOG) injection 0-9 Units  0-9 Units Subcutaneous TID WC Kelly Caldwell, Aruna, Caldwell   1 Units at 06/23/16 0843  . levothyroxine (SYNTHROID, LEVOTHROID) tablet 100 mcg  100 mcg Oral QAC  breakfast Kelly Caldwell, Aruna, Caldwell      . lisinopril (PRINIVIL,ZESTRIL) tablet 2.5 mg  2.5 mg Oral Daily Kelly Caldwell, Aruna, Caldwell   2.5 mg at 06/23/16 0918  . metoprolol tartrate (LOPRESSOR) injection 5 mg  5 mg Intravenous Q4H PRN Kelly Caldwell, Aruna, Caldwell      . morphine 2 MG/ML injection 2 mg  2 mg Intravenous Q4H PRN Kelly Caldwell, Aruna, Caldwell      . multivitamin with minerals tablet 1 tablet  1 tablet Oral Daily Kelly Caldwell, Aruna, Caldwell   1 tablet at 06/23/16 0918  . ondansetron (ZOFRAN) tablet 4 mg  4 mg Oral Q6H PRN Kelly Caldwell, Aruna, Caldwell       Or  . ondansetron (ZOFRAN) injection 4 mg  4 mg Intravenous Q6H PRN Kelly Caldwell, Aruna, Caldwell   4 mg at 06/22/16 0938  . pantoprazole (PROTONIX) EC tablet 40 mg  40 mg Oral Daily Kelly Caldwell, Aruna, Caldwell   40 mg at 06/23/16 0918  . traMADol (ULTRAM) tablet 50 mg  50 mg  Oral Q6H PRN Kelly Caldwell   50 mg at 06/21/16 2038  . vitamin C (ASCORBIC ACID) tablet 1,000 mg  1,000 mg Oral Daily Kelly Caldwell, Aruna, Caldwell   1,000 mg at 06/23/16 1610     Discharge Medications: Please see discharge summary for a list of discharge medications.  Relevant Imaging Results:  Relevant Caldwell Results:   Additional Information SSN [id number]  Sample, Kelly Caldwell, [place]

## 2016-06-23 NOTE — Progress Notes (Signed)
Patient is medically stable for D/C to [facility] today. [facility]. Per [facility] admissions coordinator [facility] patient can come today to room 205. RN will call report at [phone] and arrange EMS for transport. Clinical Child psychotherapistocial Worker (CSW) sent D/C orders to [facility] via [employer]. Patient is aware of above. CSW attempted to contact patient's daughter Burna MortimerWanda however she did not answer and a voicemail was left. Please reconsult if future social work needs arise. CSW signing off.   [employer] Hughes IncorporatedBailey Graysin Luczynski, LCSW [phone]

## 2016-06-23 NOTE — Progress Notes (Signed)
  Subjective: 2 Days Post-Op Procedure(s) (LRB): CANNULATED HIP PINNING (Right) Patient reports pain as mild.   Patient is well, and has had no acute complaints or problems Plan is for discharge to SNF when medically appropriate. Negative for chest pain and shortness of breath Fever: no Gastrointestinal:Negative for nausea and vomiting  Objective: Vital signs in last 24 hours: Temp:  [97.6 F (36.4 C)-98.9 F (37.2 C)] 98.9 F (37.2 C) (05/22 0721) Pulse Rate:  [67-76] 75 (05/22 0721) Resp:  [16-20] 20 (05/22 0721) BP: (136-150)/(54-60) 150/57 (05/22 0721) SpO2:  [85 %-95 %] 93 % (05/22 0723)  Intake/Output from previous day:  Intake/Output Summary (Last 24 hours) at 06/23/16 1011 Last data filed at 06/23/16 0956  Gross per 24 hour  Intake              770 ml  Output                0 ml  Net              770 ml    Intake/Output this shift: Total I/O In: 240 [P.O.:240] Out: -   Labs:  Recent Labs  06/21/16 0835 06/22/16 0425  HGB 12.6 12.0    Recent Labs  06/21/16 0835 06/22/16 0425  WBC 15.3* 15.6*  RBC 3.79* 3.65*  HCT 37.5 36.4  PLT 235 216    Recent Labs  06/22/16 0425 06/23/16 0422  NA 139 139  K 3.4* 3.9  CL 103 103  CO2 28 26  BUN 8 13  CREATININE 0.75 0.62  GLUCOSE 163* 151*  CALCIUM 8.8* 9.3   No results for input(s): LABPT, INR in the last 72 hours.   EXAM General - Patient is Alert and Appropriate Extremity - ABD soft Sensation intact distally Intact pulses distally Dorsiflexion/Plantar flexion intact Incision: dressing C/D/I No cellulitis present Dressing/Incision - clean, dry, no drainage Motor Function - intact, moving foot and toes well on exam.  Abdomen soft without tympany.  Normal BS.  Past Medical History:  Diagnosis Date  . Arthritis   . HLD (hyperlipidemia)   . HTN (hypertension)   . Hypothyroidism   . Urinary frequency     Assessment/Plan: 2 Days Post-Op Procedure(s) (LRB): CANNULATED HIP PINNING  (Right) Active Problems:   Hip fracture (HCC)  Estimated body mass index is 19.53 kg/m as calculated from the following:   Height as of this encounter: 5' (1.524 m).   Weight as of this encounter: 45.4 kg (100 lb). Advance diet Up with therapy D/C IV fluids when tolerating po intake.  Labs reviewed, Hypokalemia resolved.  No signs of infection to the right hip. Pt has had a BM since yesterday. Up with therapy today.  Upon discharge from the hospital, continue Lovenox 40mg  daily x 14 days. [facility] in 2 weeks for staple removal and repeat x-rays.  DVT Prophylaxis - Lovenox, Foot Pumps and TED hose Partial weightbearing to the right lower extremity.  Valeria BatmanJ. Lance Kelly Filippone, PA-C [facility] 06/23/2016, 10:11 AM

## 2016-06-23 NOTE — Clinical Social Work Placement (Signed)
   CLINICAL SOCIAL WORK PLACEMENT  NOTE  Date:  06/23/2016  Patient Details  Name: Kelly MusaMarguerite Stuber MRN: [id number] Date of Birth: 06-14-1915  Clinical Social Work is seeking post-discharge placement for this patient at the Skilled  Nursing Facility level of care (*CSW will initial, date and re-position this form in  chart as items are completed):  Yes   Patient/family provided with [place] Clinical Social Work Department's list of facilities offering this level of care within the geographic area requested by the patient (or if unable, by the patient's family).  Yes   Patient/family informed of their freedom to choose among providers that offer the needed level of care, that participate in Medicare, Medicaid or managed care program needed by the patient, have an available bed and are willing to accept the patient.  Yes   Patient/family informed of [facility]'s ownership interest in [facility] and [facility], as well as of the fact that they are under no obligation to receive care at these facilities.  PASRR submitted to EDS on 06/21/16     PASRR number received on 06/21/16     Existing PASRR number confirmed on       FL2 transmitted to all facilities in geographic area requested by pt/family on 06/22/16     FL2 transmitted to all facilities within larger geographic area on       Patient informed that his/her managed care company has contracts with or will negotiate with certain facilities, including the following:        Yes   Patient/family informed of bed offers received.  Patient [facility] bed at  [facility]     Physician recommends and patient chooses bed at      Patient to be transferred to  [facility] ) on 06/23/16.  Patient to be transferred to facility by  [facility] )     Patient family notified on 06/23/16 of transfer.  Name of family member notified:   (CSW left patient's daughter Burna MortimerWanda a Engineer, technical salesvoicemail. )     PHYSICIAN        Additional Comment:    _______________________________________________ Fynlee Rowlands, Darleen CrockerBailey M, LCSW 06/23/2016, 12:13 PM

## 2016-06-23 NOTE — Progress Notes (Signed)
Physical Therapy Treatment Patient Details Name: Kelly Caldwell MRN: [id number] DOB: January 25, 1916 Today's Date: 06/23/2016    History of Present Illness presented to ER secondary to fall with R hip pain; admitted with impacted R femoral neck fracture, status post cannulated hip pinning 06/21/16, PWB R LE.    PT Comments    Pt transfering with nursing staff upon arrival with difficulty.  Assisted as needed to bedside commode.  Pt stood with mod a x 1 for care then transferred with +2 mod assist to bed.  Max a x 2 to return to supine and reposition.  Pt generally fatigued this am with activity.     Follow Up Recommendations  SNF     Equipment Recommendations  Rolling walker with 5" wheels    Recommendations for Other Services       Precautions / Restrictions Precautions Precautions: Fall Restrictions Weight Bearing Restrictions: Yes RLE Weight Bearing: Partial weight bearing RLE Partial Weight Bearing Percentage or Pounds: 50    Mobility  Bed Mobility Overal bed mobility: Needs Assistance Bed Mobility: Sit to Supine     Supine to sit: Mod assist;+2 for physical assistance Sit to supine: Mod assist;+2 for physical assistance   General bed mobility comments: Pt resisting at times, holds trunk in extension making transition challenging  Transfers Overall transfer level: Needs assistance Equipment used: Rolling walker (2 wheeled) Transfers: Sit to/from [employer] Sit to Stand: Mod assist Stand pivot transfers: Mod assist;+2 physical assistance       General transfer comment: cuing for hand placement; assist for lift off, static standing balance and PWB R LE  Ambulation/Gait Ambulation/Gait assistance: Mod assist;+2 physical assistance Ambulation Distance (Feet): 5 Feet Assistive device: Rolling walker (2 wheeled)     Gait velocity interpretation: <1.8 ft/sec, indicative of risk for recurrent falls General Gait Details: assist to advance RLE with  gait, Very small hesitant steps.  PWB reviewed and encouraged.  Requires +1 assist for balance at all times.   Stairs            Wheelchair Mobility    Modified Rankin (Stroke Patients Only)       Balance Overall balance assessment: Needs assistance Sitting-balance support: Feet supported Sitting balance-Leahy Scale: Fair     Standing balance support: Bilateral upper extremity supported Standing balance-Leahy Scale: Poor                              Cognition Arousal/Alertness: Awake/alert Behavior During Therapy: WFL for tasks assessed/performed Overall Cognitive Status: Within Functional Limits for tasks assessed                                        Exercises Other Exercises Other Exercises: AAROM for heel slides, SLR, Ab/Ad, SAQ x 10 RLE.  Pt with difficulty understanding isometric exercises.    General Comments        Pertinent Vitals/Pain Pain Assessment: Faces Faces Pain Scale: Hurts even more Pain Location: "It's OK" Pain Descriptors / Indicators: Aching;Guarding;Grimacing Pain Intervention(s): Monitored during session    Home Living                      Prior Function            PT Goals (current goals can now be found in the care plan section) Progress towards PT goals:  Progressing toward goals    Frequency    BID      PT Plan Current plan remains appropriate    Co-evaluation              AM-PAC PT "6 Clicks" Daily Activity  Outcome Measure  Difficulty turning over in bed (including adjusting bedclothes, sheets and blankets)?: Total Difficulty moving from lying on back to sitting on the side of the bed? : Total Difficulty sitting down on and standing up from a chair with arms (e.g., wheelchair, bedside commode, etc,.)?: Total Help needed moving to and from a bed to chair (including a wheelchair)?: A Lot Help needed walking in hospital room?: A Lot Help needed climbing 3-5 steps with a  railing? : Total 6 Click Score: 8    End of Session Equipment Utilized During Treatment: Gait belt;Oxygen Activity Tolerance: Patient limited by fatigue Patient left: in bed;with bed alarm set;with call bell/phone within reach;with nursing/sitter in room Nurse Communication: Mobility status Pain - Right/Left: Right Pain - part of body: Hip     Time: [id number] PT Time Calculation (min) (ACUTE ONLY): 11 min  Charges:  $Gait Training: 8-22 mins $Therapeutic Exercise: 8-22 mins $Therapeutic Activity: 8-22 mins                    G Codes:       Danielle DessSarah Jameek Bruntz, PTA 06/23/16, 11:58 AM

## 2016-06-23 NOTE — Progress Notes (Signed)
Rept called to Engineer, waterMelissa RN at [facility]. No change in pt from AM assessment. Pt pain controlled. Family at bedside and aware of transfer. Will continue to monitor.

## 2016-06-23 NOTE — Discharge Instructions (Signed)
Upon discharge from the hospital, continue Lovenox 40mg  daily x 14 days. Follow-up with [facility] in 2 weeks for staple removal and repeat x-rays. Heart healthy and ADA diet.

## 2016-06-23 NOTE — Progress Notes (Signed)
Rept to Dr. Imogene Burnhen. Pt desat to 86-87% on RA this AM. Pt oxy sat on 2 lpm quickly rose to 93% on 2 lpm. Pt confused through the night but mental status better now that family has been visiting. No new orders at this time. Will continue to monitor.

## 2016-06-23 NOTE — Progress Notes (Signed)
Nonemergency transport called. Will continue to monitor.

## 2016-06-23 NOTE — Progress Notes (Signed)
[facility] without incident per MD order via stretcher. Vital signs stable. Pt pain tolerable on discharge. No change in pt from AM assessment. Family aware of transfer. Family visiting when transfer occurred.

## 2016-06-23 NOTE — Discharge Summary (Signed)
[facility] [place] [facility]   PATIENT NAME: Kelly Caldwell    MR#:  [id number]  DATE OF BIRTH:  Feb 05, 1915  DATE OF ADMISSION:  06/21/2016   ADMITTING PHYSICIAN: Ramonita Lab, MD  DATE OF DISCHARGE: 06/23/2016 PRIMARY CARE PHYSICIAN: Corky Downs, MD   ADMISSION DIAGNOSIS:  Closed fracture of right hip, initial encounter (HCC) [S72.001A] DISCHARGE DIAGNOSIS:  Active Problems:   Hip fracture (HCC)  SECONDARY DIAGNOSIS:   Past Medical History:  Diagnosis Date  . Arthritis   . HLD (hyperlipidemia)   . HTN (hypertension)   . Hypothyroidism   . Urinary frequency    HOSPITAL COURSE:   83 year old female with history of diabetes and hypertension who presents after mechanical fall and suffered a right hip fracture.  1.Right impacted femoral neck fracture: Postoperative day #2 CANNULATED HIP PINNING DVT prophylaxis with Lovenox for 14 days per ortho.  2. Essential hypertension: Continue lisinopril and Norvasc  3. Diabetes: Continue sliding scale with ADA diet  4. Hypothyroid: Continue Synthroid  5. Hypokalemia, mild: replaced and improved.  6.UTI Started Rocephin and change to keflex for 5 days. Urine Cx: mixture.  DISCHARGE CONDITIONS:  Stable, discharge to SNF today. CONSULTS OBTAINED:  Treatment Team:  Lamar Blinks, MD Donato Heinz, MD DRUG ALLERGIES:  No Known Allergies DISCHARGE MEDICATIONS:   Allergies as of 06/23/2016   No Known Allergies     Medication List    STOP taking these medications   ibuprofen 200 MG tablet Commonly known as:  ADVIL,MOTRIN     TAKE these medications   acetaminophen 650 MG suppository Commonly known as:  TYLENOL Place 1 suppository (650 mg total) rectally every 6 (six) hours as needed for mild pain (or Fever >/= 101).   ALPRAZolam 0.5 MG tablet Commonly known as:  XANAX Take 1 tablet (0.5 mg total) by mouth at bedtime.   amLODipine 2.5 MG tablet Commonly known as:   NORVASC Take 1 tablet by mouth daily.   aspirin EC 81 MG tablet Take 81 mg by mouth at bedtime.   atorvastatin 40 MG tablet Commonly known as:  LIPITOR Take 40 mg by mouth at bedtime.   cephALEXin 500 MG capsule Commonly known as:  KEFLEX Take 1 capsule (500 mg total) by mouth 2 (two) times daily.   cholecalciferol 1000 units tablet Commonly known as:  VITAMIN D Take 4,000 Units by mouth daily.   Co Q-10 200 MG Caps Take 1 capsule by mouth daily.   docusate sodium 100 MG capsule Commonly known as:  COLACE Take 1 capsule (100 mg total) by mouth daily. Start taking on:  06/24/2016   enoxaparin 30 MG/0.3ML injection Commonly known as:  LOVENOX Inject 0.3 mLs (30 mg total) into the skin daily. Start taking on:  06/24/2016   levothyroxine 100 MCG tablet Commonly known as:  SYNTHROID, LEVOTHROID Take 100 mcg by mouth daily before breakfast.   lisinopril 2.5 MG tablet Commonly known as:  PRINIVIL,ZESTRIL Take 2.5 mg by mouth daily.   metFORMIN 1000 MG tablet Commonly known as:  GLUCOPHAGE Take 1,000 mg by mouth 2 (two) times daily with a meal.   multivitamin tablet Take 1 tablet by mouth daily.   traMADol 50 MG tablet Commonly known as:  ULTRAM Take 1 tablet (50 mg total) by mouth every 6 (six) hours as needed for moderate pain.   vitamin C 1000 MG tablet Take 1,000 mg by mouth daily.        DISCHARGE INSTRUCTIONS:  See AVS.  If you experience worsening of your admission symptoms, develop shortness of breath, life threatening emergency, suicidal or homicidal thoughts you must seek medical attention immediately by calling 911 or calling your MD immediately  if symptoms less severe.  You Must read complete instructions/literature along with all the possible adverse reactions/side effects for all the Medicines you take and that have been prescribed to you. Take any new Medicines after you have completely understood and accpet all the possible adverse reactions/side  effects.   Please note  You were cared for by a hospitalist during your hospital stay. If you have any questions about your discharge medications or the care you received while you were in the hospital after you are discharged, you can call the unit and asked to speak with the hospitalist on call if the hospitalist that took care of you is not available. Once you are discharged, your primary care physician will handle any further medical issues. Please note that NO REFILLS for any discharge medications will be authorized once you are discharged, as it is imperative that you return to your primary care physician (or establish a relationship with a primary care physician if you do not have one) for your aftercare needs so that they can reassess your need for medications and monitor your lab values.    On the day of Discharge:  VITAL SIGNS:  Blood pressure (!) 150/57, pulse 75, temperature 98.9 F (37.2 C), temperature source Oral, resp. rate 20, height 5' (1.524 m), weight 100 lb (45.4 kg), SpO2 93 %. PHYSICAL EXAMINATION:  GENERAL:  83 y.o.-year-old patient lying in the bed with no acute distress.  EYES: Pupils equal, round, reactive to light and accommodation. No scleral icterus. Extraocular muscles intact.  HEENT: Head atraumatic, normocephalic. Oropharynx and nasopharynx clear.  NECK:  Supple, no jugular venous distention. No thyroid enlargement, no tenderness.  LUNGS: Normal breath sounds bilaterally, no wheezing, rales,rhonchi or crepitation. No use of accessory muscles of respiration.  CARDIOVASCULAR: S1, S2 normal. No murmurs, rubs, or gallops.  ABDOMEN: Soft, non-tender, non-distended. Bowel sounds present. No organomegaly or mass.  EXTREMITIES: No pedal edema, cyanosis, or clubbing.  NEUROLOGIC: Cranial nerves II through XII are intact. Muscle strength 5/5 in all extremities. Sensation intact. Gait not checked.  PSYCHIATRIC: The patient is alert and oriented x 3.  SKIN: No obvious  rash, lesion, or ulcer.  DATA REVIEW:   CBC  Recent Labs Lab 06/22/16 0425  WBC 15.6*  HGB 12.0  HCT 36.4  PLT 216    Chemistries   Recent Labs Lab 06/22/16 0425 06/23/16 0422  NA 139 139  K 3.4* 3.9  CL 103 103  CO2 28 26  GLUCOSE 163* 151*  BUN 8 13  CREATININE 0.75 0.62  CALCIUM 8.8* 9.3  AST 23  --   ALT 15  --   ALKPHOS 109  --   BILITOT 0.9  --      Microbiology Results  Results for orders placed or performed during the hospital encounter of 06/21/16  Surgical PCR screen     Status: None   Collection Time: 06/21/16  1:33 PM  Result Value Ref Range Status   MRSA, PCR NEGATIVE NEGATIVE Final   Staphylococcus aureus NEGATIVE NEGATIVE Final    Comment:        The Xpert SA Assay (FDA approved for NASAL specimens in patients over 81 years of age), is one component of a comprehensive surveillance program.  Test performance has been validated by  [facility] for patients greater than or equal to 79 year old. It is not intended to diagnose infection nor to guide or monitor treatment.   Urine culture     Status: Abnormal   Collection Time: 06/22/16  9:50 AM  Result Value Ref Range Status   Specimen Description URINE, CLEAN CATCH  Final   Special Requests NONE  Final   Culture MULTIPLE SPECIES PRESENT, SUGGEST RECOLLECTION (A)  Final   Report Status 06/23/2016 FINAL  Final    RADIOLOGY:  No results found.   Management plans discussed with the patient, family and they are in agreement.  CODE STATUS: DNR   TOTAL TIME TAKING CARE OF THIS PATIENT: 33 minutes.    Shaune Pollack M.D on 06/23/2016 at 11:50 AM  Between 7am to 6pm - Pager - [phone]  After 6pm go to [email or web address] - Social research officer, government  [facility]  Office  [phone]  CC: Primary care physician; Corky Downs, MD   Note: This dictation was prepared with Dragon dictation along with smaller phrase technology. Any transcriptional errors that result from  this process are unintentional.

## 2016-06-23 NOTE — Progress Notes (Signed)
[facility] to visit patient and her daughter Burna MortimerWanda on yesterday and completed admissions paper work. [employer] authorization has been received, auth # [id number], RVB, recommended 534 therapy hours per week. Patient and her daughter Burna MortimerWanda [facility] Marcelino DusterMichelle is also aware [employer] approved SNF. [facility].   Baker Hughes IncorporatedBailey Mynor Witkop, LCSW [phone]

## 2016-06-23 NOTE — Progress Notes (Signed)
Patient has shown increased signs of agitation with confusion around 0200.  Refuses scd's, medication and pulling tele box off.  Patient was able to be calmed down slightly with verbal cueing.  Will cont to monitor.

## 2016-06-23 NOTE — Progress Notes (Signed)
Physical Therapy Treatment Patient Details Name: Kelly MusaMarguerite Hiltner MRN: [id number] DOB: 05/03/1915 Today's Date: 06/23/2016    History of Present Illness presented to ER secondary to fall with R hip pain; admitted with impacted R femoral neck fracture, status post cannulated hip pinning 06/21/16, PWB R LE.    PT Comments    Pt in bed, ready for session.  Participated in exercises as described below.  To edge of bed with mod a x 2.  Pt holding trunk in extension today making transition more challenging.  She was able to stand with mod a x 2 and ambulate 6 steps with assist to advance RLE.  She remains significantly unsteady on her feet and requires +1 assist for balance at all times.  +1 assist for chair.    Pt continues to require O2 support for mobility skills.  94% at rest on 2 lpm.  Without O2 for gait, pt decreased to 83% without complaints.  O2 re-applied and returned to 94% with proper breathing.    Follow Up Recommendations  SNF     Equipment Recommendations  Rolling walker with 5" wheels    Recommendations for Other Services       Precautions / Restrictions Precautions Precautions: Fall Restrictions Weight Bearing Restrictions: Yes RLE Weight Bearing: Partial weight bearing RLE Partial Weight Bearing Percentage or Pounds: 50    Mobility  Bed Mobility Overal bed mobility: Needs Assistance Bed Mobility: Supine to Sit     Supine to sit: Mod assist;+2 for physical assistance     General bed mobility comments: Pt resisting at times, holds trunk in extension making transition challenging  Transfers Overall transfer level: Needs assistance Equipment used: Rolling walker (2 wheeled) Transfers: Sit to/from Stand Sit to Stand: Mod assist;+2 physical assistance         General transfer comment: cuing for hand placement; assist for lift off, static standing balance and PWB R LE  Ambulation/Gait Ambulation/Gait assistance: Mod assist;+2 physical  assistance Ambulation Distance (Feet): 5 Feet Assistive device: Rolling walker (2 wheeled)     Gait velocity interpretation: <1.8 ft/sec, indicative of risk for recurrent falls General Gait Details: assist to advance RLE with gait, Very small hesitant steps.  PWB reviewed and encouraged.  Requires +1 assist for balance at all times.   Stairs            Wheelchair Mobility    Modified Rankin (Stroke Patients Only)       Balance Overall balance assessment: Needs assistance Sitting-balance support: Feet unsupported;Single extremity supported Sitting balance-Leahy Scale: Fair     Standing balance support: Bilateral upper extremity supported Standing balance-Leahy Scale: Poor                              Cognition Arousal/Alertness: Awake/alert Behavior During Therapy: WFL for tasks assessed/performed Overall Cognitive Status: Within Functional Limits for tasks assessed                                        Exercises Other Exercises Other Exercises: AAROM for heel slides, SLR, Ab/Ad, SAQ x 10 RLE.  Pt with difficulty understanding isometric exercises.    General Comments        Pertinent Vitals/Pain Pain Assessment: 0-10 Pain Location: "It's OK" Pain Intervention(s): Monitored during session    Home Living  Prior Function            PT Goals (current goals can now be found in the care plan section) Progress towards PT goals: Progressing toward goals    Frequency    BID      PT Plan Current plan remains appropriate    Co-evaluation              AM-PAC PT "6 Clicks" Daily Activity  Outcome Measure  Difficulty turning over in bed (including adjusting bedclothes, sheets and blankets)?: Total Difficulty moving from lying on back to sitting on the side of the bed? : Total Difficulty sitting down on and standing up from a chair with arms (e.g., wheelchair, bedside commode, etc,.)?:  Total Help needed moving to and from a bed to chair (including a wheelchair)?: A Lot Help needed walking in hospital room?: A Lot Help needed climbing 3-5 steps with a railing? : A Lot 6 Click Score: 9    End of Session Equipment Utilized During Treatment: Gait belt;Oxygen Activity Tolerance: Patient tolerated treatment well Patient left: in chair;with chair alarm set;with call bell/phone within reach   Pain - Right/Left: Right Pain - part of body: Hip     Time: 1610-9604 PT Time Calculation (min) (ACUTE ONLY): 23 min  Charges:  $Gait Training: 8-22 mins $Therapeutic Exercise: 8-22 mins                    G Codes:      Danielle Dess, PTA 06/23/16, 10:51 AM

## 2016-06-25 DIAGNOSIS — E119 Type 2 diabetes mellitus without complications: Secondary | ICD-10-CM | POA: Diagnosis not present

## 2016-06-25 DIAGNOSIS — R05 Cough: Secondary | ICD-10-CM | POA: Diagnosis present

## 2016-06-25 DIAGNOSIS — R509 Fever, unspecified: Secondary | ICD-10-CM | POA: Diagnosis not present

## 2016-06-25 LAB — COMPREHENSIVE METABOLIC PANEL
ALK PHOS: 97 U/L (ref 38–126)
ALT: 11 U/L — ABNORMAL LOW (ref 14–54)
ANION GAP: 6 (ref 5–15)
AST: 19 U/L (ref 15–41)
Albumin: 2.9 g/dL — ABNORMAL LOW (ref 3.5–5.0)
BILIRUBIN TOTAL: 0.6 mg/dL (ref 0.3–1.2)
BUN: 17 mg/dL (ref 6–20)
CALCIUM: 9.5 mg/dL (ref 8.9–10.3)
CO2: 32 mmol/L (ref 22–32)
Chloride: 96 mmol/L — ABNORMAL LOW (ref 101–111)
Creatinine, Ser: 0.5 mg/dL (ref 0.44–1.00)
GFR calc Af Amer: >60 mL/min (ref >60)
GLUCOSE: 125 mg/dL — AB (ref 65–99)
POTASSIUM: 4.7 mmol/L (ref 3.5–5.1)
Sodium: 134 mmol/L — ABNORMAL LOW (ref 135–145)
TOTAL PROTEIN: 6.7 g/dL (ref 6.5–8.1)

## 2016-06-25 LAB — CBC WITH DIFFERENTIAL/PLATELET
Basophils Absolute: 0 10*3/uL (ref 0–0.1)
Basophils Relative: 0 %
Eosinophils Absolute: 0.2 10*3/uL (ref 0–0.7)
Eosinophils Relative: 2 %
HCT: 36.5 % (ref 35.0–47.0)
Hemoglobin: 12.1 g/dL (ref 12.0–16.0)
Lymphocytes Relative: 11 %
Lymphs Abs: 1.2 10*3/uL (ref 1.0–3.6)
MCH: 33.2 pg (ref 26.0–34.0)
MCHC: 33.1 g/dL (ref 32.0–36.0)
MCV: 100.3 fL — ABNORMAL HIGH (ref 80.0–100.0)
Monocytes Absolute: 1.3 10*3/uL — ABNORMAL HIGH (ref 0.2–0.9)
Monocytes Relative: 12 %
Neutro Abs: 8.2 10*3/uL — ABNORMAL HIGH (ref 1.4–6.5)
Neutrophils Relative %: 75 %
Platelets: 244 10*3/uL (ref 150–440)
RBC: 3.64 MIL/uL — ABNORMAL LOW (ref 3.80–5.20)
RDW: 14.6 % — ABNORMAL HIGH (ref 11.5–14.5)
WBC: 11 10*3/uL (ref 3.6–11.0)

## 2016-06-25 LAB — GLUCOSE, CAPILLARY
Glucose-Capillary: 170 mg/dL — ABNORMAL HIGH (ref 65–99)
Glucose-Capillary: 180 mg/dL — ABNORMAL HIGH (ref 65–99)
Glucose-Capillary: 190 mg/dL — ABNORMAL HIGH (ref 65–99)

## 2016-06-26 ENCOUNTER — Non-Acute Institutional Stay (SKILLED_NURSING_FACILITY): Payer: Medicare Other | Admitting: Gerontology

## 2016-06-26 DIAGNOSIS — J181 Lobar pneumonia, unspecified organism: Secondary | ICD-10-CM

## 2016-06-26 DIAGNOSIS — G8929 Other chronic pain: Secondary | ICD-10-CM

## 2016-06-26 DIAGNOSIS — J189 Pneumonia, unspecified organism: Secondary | ICD-10-CM

## 2016-06-26 DIAGNOSIS — R400 Somnolence: Secondary | ICD-10-CM

## 2016-06-26 DIAGNOSIS — S72001D Fracture of unspecified part of neck of right femur, subsequent encounter for closed fracture with routine healing: Secondary | ICD-10-CM | POA: Diagnosis not present

## 2016-06-26 DIAGNOSIS — M545 Low back pain: Secondary | ICD-10-CM

## 2016-06-26 DIAGNOSIS — R05 Cough: Secondary | ICD-10-CM | POA: Diagnosis not present

## 2016-06-26 LAB — GLUCOSE, CAPILLARY
GLUCOSE-CAPILLARY: 181 mg/dL — AB (ref 65–99)
GLUCOSE-CAPILLARY: 176 mg/dL — AB (ref 65–99)
GLUCOSE-CAPILLARY: 206 mg/dL — AB (ref 65–99)
Glucose-Capillary: 139 mg/dL — ABNORMAL HIGH (ref 65–99)
Glucose-Capillary: 136 mg/dL — ABNORMAL HIGH (ref 65–99)

## 2016-06-29 DIAGNOSIS — M545 Low back pain, unspecified: Secondary | ICD-10-CM | POA: Insufficient documentation

## 2016-06-29 DIAGNOSIS — R05 Cough: Secondary | ICD-10-CM | POA: Diagnosis not present

## 2016-06-29 NOTE — Progress Notes (Signed)
Location:      Place of Service:  SNF (31) Provider:  Toni Arthurs, NP-C  Cletis Athens, MD  Patient Care Team: Cletis Athens, MD as PCP - General (Internal Medicine)  Extended Emergency Contact Information Primary Emergency Contact: Samantha Crimes Address: [address]          [place], [place] [address] Home Phone: [phone] Work Phone: [phone] Relation: None  Code Status:  DNR Goals of care: Advanced Directive information Advanced Directives 06/21/2016  Does Patient Have a Medical Advance Directive? No  Would patient like information on creating a medical advance directive? No - Patient declined     Chief Complaint  Patient presents with  . Acute Visit    HPI:  Pt is a 83 y.o. female seen today for an acute visit for Pneumonia, somnolence, back pain and hip fracture. Pt was having cough, congestion, low O2 sats. Family described intermittent periods of gasping for air. Family reports she was not on oxygen at home prior to entering the hospital for Right hip fracture with subsequent UTI/ encephalopathy.  Pt was discharged on po Keflex. Family reports she has had increased somnolence since the hospitalization. She was given Tramadol and Alprazolam together the night before. However, she has not had pain meds, etc in over 8 hours and somnolence continues. She does arouse to name call and tactile stimulation, but drifts back to sleep easily. She is oriented. Pt has minimal air movement in BLL. Family reports pt has chronic lower back pain. Family has applied an OTC pain patch (at home) and pt reports improvement in symptoms. Hip dressing CDI. Incision well approximated. Pt denies pain (when still) but flinches and guards leg when leg moved. Discussed with family the high potential for poor outcomes d/t injury and co-morbidities, despite pt's strong/stubborn nature. We discussed statistical probability of high mortality rate. Family verbalized understanding. Family was  agreeable to a Palliative Care Consult. Will continue to follow closely. VSS. No other complaints.    Past Medical History:  Diagnosis Date  . Arthritis   . HLD (hyperlipidemia)   . HTN (hypertension)   . Hypothyroidism   . Urinary frequency    Past Surgical History:  Procedure Laterality Date  . HIP PINNING,CANNULATED Right 06/21/2016   Procedure: CANNULATED HIP PINNING;  Surgeon: Claud Kelp, MD;  Location: [facility] ORS;  Service: Orthopedics;  Laterality: Right;  . INCONTINENCE SURGERY      No Known Allergies  Allergies as of 06/26/2016   No Known Allergies     Medication List       Accurate as of 06/26/16 11:59 PM. Always use your most recent med list.          acetaminophen 650 MG suppository Commonly known as:  TYLENOL Place 1 suppository (650 mg total) rectally every 6 (six) hours as needed for mild pain (or Fever >/= 101).   ALPRAZolam 0.5 MG tablet Commonly known as:  XANAX Take 1 tablet (0.5 mg total) by mouth at bedtime.   amLODipine 2.5 MG tablet Commonly known as:  NORVASC Take 1 tablet by mouth daily.   aspirin EC 81 MG tablet Take 81 mg by mouth at bedtime.   atorvastatin 40 MG tablet Commonly known as:  LIPITOR Take 40 mg by mouth at bedtime.   cephALEXin 500 MG capsule Commonly known as:  KEFLEX Take 1 capsule (500 mg total) by mouth 2 (two) times daily.   cholecalciferol 1000 units tablet Commonly known as:  VITAMIN D Take 4,000 Units  by mouth daily.   Co Q-10 200 MG Caps Take 1 capsule by mouth daily.   docusate sodium 100 MG capsule Commonly known as:  COLACE Take 1 capsule (100 mg total) by mouth daily.   enoxaparin 30 MG/0.3ML injection Commonly known as:  LOVENOX Inject 0.3 mLs (30 mg total) into the skin daily.   levothyroxine 100 MCG tablet Commonly known as:  SYNTHROID, LEVOTHROID Take 100 mcg by mouth daily before breakfast.   lisinopril 2.5 MG tablet Commonly known as:  PRINIVIL,ZESTRIL Take 2.5 mg by mouth daily.     metFORMIN 1000 MG tablet Commonly known as:  GLUCOPHAGE Take 1,000 mg by mouth 2 (two) times daily with a meal.   multivitamin tablet Take 1 tablet by mouth daily.   traMADol 50 MG tablet Commonly known as:  ULTRAM Take 1 tablet (50 mg total) by mouth every 6 (six) hours as needed for moderate pain.   vitamin C 1000 MG tablet Take 1,000 mg by mouth daily.       Review of Systems  Constitutional: Positive for activity change and fatigue. Negative for appetite change, chills, diaphoresis and fever.  HENT: Negative for congestion, sneezing, sore throat, trouble swallowing and voice change.   Respiratory: Positive for cough and shortness of breath. Negative for apnea, choking, chest tightness and wheezing.   Cardiovascular: Negative for chest pain, palpitations and leg swelling.  Gastrointestinal: Negative for abdominal distention, abdominal pain, constipation, diarrhea and nausea.  Genitourinary: Negative for difficulty urinating, dysuria, frequency and urgency.  Musculoskeletal: Positive for arthralgias (typical arthritis), gait problem and joint swelling. Negative for back pain and myalgias.  Skin: Positive for wound. Negative for color change, pallor and rash.  Neurological: Positive for weakness. Negative for dizziness, tremors, syncope, speech difficulty, numbness and headaches.  Psychiatric/Behavioral: Negative for agitation and behavioral problems.  All other systems reviewed and are negative.    There is no immunization history on file for this patient. Pertinent  Health Maintenance Due  Topic Date Due  . DEXA SCAN  08-20-1982  . PNA vac Low Risk Adult (1 of 2 - PCV13) 08-20-1982  . INFLUENZA VACCINE  09/02/2016   No flowsheet data found. Functional Status Survey:    Vitals:   06/26/16 0500  BP: (!) 158/72  Pulse: 87  Resp: (!) 22  Temp: 98.2 F (36.8 C)  SpO2: 95%   There is no height or weight on file to calculate BMI. Physical Exam  Constitutional: She  is oriented to person, place, and time. Vital signs are normal. She appears well-developed and well-nourished. She appears lethargic. She is active and cooperative. She does not appear ill. No distress. Nasal cannula in place.  HENT:  Head: Normocephalic and atraumatic.  Mouth/Throat: Uvula is midline, oropharynx is clear and moist and mucous membranes are normal. Mucous membranes are not pale, not dry and not cyanotic.  Eyes: Conjunctivae, EOM and lids are normal. Pupils are equal, round, and reactive to light.  Neck: Trachea normal, normal range of motion and full passive range of motion without pain. Neck supple. No JVD present. No tracheal deviation, no edema and no erythema present. No thyromegaly present.  Cardiovascular: Normal rate, normal heart sounds, intact distal pulses and normal pulses.  An irregular rhythm present. Exam reveals no gallop, no distant heart sounds and no friction rub.   No murmur heard. Pulses:      Dorsalis pedis pulses are 2+ on the right side, and 2+ on the left side.  Pulmonary/Chest: Effort normal.  No accessory muscle usage. No respiratory distress. She has decreased breath sounds in the right lower field and the left lower field. She has no wheezes. She has no rhonchi. She has no rales. She exhibits no tenderness.  Abdominal: Normal appearance and bowel sounds are normal. She exhibits no distension and no ascites. There is no tenderness.  Musculoskeletal: She exhibits no edema.       Right hip: She exhibits decreased range of motion, decreased strength, tenderness and laceration.  Expected osteoarthritis, stiffness; calves soft, supple. Negative Homan's sign  Neurological: She is oriented to person, place, and time. She has normal strength. She appears lethargic.  Skin: Skin is warm and dry. Laceration (right hip incision) noted. She is not diaphoretic. No cyanosis. No pallor. Nails show no clubbing.  Psychiatric: She has a normal mood and affect. Her speech is  normal and behavior is normal. Judgment and thought content normal. Cognition and memory are normal.  Nursing note and vitals reviewed.   Labs reviewed:  Recent Labs  06/22/16 0425 06/23/16 0422 06/25/16 1338  NA 139 139 134*  K 3.4* 3.9 4.7  CL 103 103 96*  CO2 28 26 32  GLUCOSE 163* 151* 125*  BUN _0 CREATININE 0.75 0.62 0.50  CALCIUM 8.8* 9.3 9.5    Recent Labs  06/21/16 0835 06/22/16 0425 06/25/16 1338  AST _1 ALT 19 15 11*  ALKPHOS 125 109 97  BILITOT 0.7 0.9 0.6  PROT 7.3 6.8 6.7  ALBUMIN 3.7 3.3* 2.9*    Recent Labs  06/21/16 0835 06/22/16 0425 06/25/16 1338  WBC 15.3* 15.6* 11.0  NEUTROABS 12.4*  --  8.2*  HGB 12.6 12.0 12.1  HCT 37.5 36.4 36.5  MCV 99.0 99.8 100.3*  PLT 235 216 244   Lab Results  Component Value Date   TSH 0.985 06/22/2016   Lab Results  Component Value Date   HGBA1C 8.1 (H) 06/22/2016   Lab Results  Component Value Date   CHOL 130 06/22/2016   HDL 71 06/22/2016   LDLCALC 47 06/22/2016   TRIG 59 06/22/2016   CHOLHDL 1.8 06/22/2016    Significant Diagnostic Results in last 30 days:  Ct Head Wo Contrast  Result Date: 06/21/2016 CLINICAL DATA:  Pt came to ED via EMS from home. Pt has unwitnessed fall, pt does not remember falling. Thinks she slipped out the end of her bed. Reports right hip pain. Not on blood thinners. EXAM: CT HEAD WITHOUT CONTRAST CT CERVICAL SPINE WITHOUT CONTRAST TECHNIQUE: Multidetector CT imaging of the head and cervical spine was performed following the standard protocol without intravenous contrast. Multiplanar CT image reconstructions of the cervical spine were also generated. COMPARISON:  04/24/2005 FINDINGS: CT HEAD FINDINGS Brain: 2 cm hyperdense lesion in the right middle cranial fossa abutting the tentorium and involving the temporal lobe. Lesion not evident on prior study from 2007. No definite dural tail. No acute intracranial hemorrhage, midline shift, or focal parenchymal edema.  Acute infarct may be inapparent on noncontrast CT. Mild diffuse parenchymal atrophy. Patchy areas of hypoattenuation in deep and periventricular white matter bilaterally. Ventricles and sulci normal in size and symmetry. Vascular: Atherosclerotic and physiologic intracranial calcifications. Skull: Normal. Negative for fracture or focal lesion. Sinuses/Orbits: No acute finding. Other: None. CT CERVICAL SPINE FINDINGS Alignment: Normal. Skull base and vertebrae: No acute fracture. No primary bone lesion or focal pathologic process. Soft tissues and spinal canal: No prevertebral fluid or swelling. No visible canal hematoma. Bilateral  carotid arterial calcifications. Disc levels: C2-3 mild facet DJD right greater than left C3-4 mild narrowing of the interspace. Facet and uncovertebral DJD resulting in foraminal stenosis right worse than left. C4-5 Fusion across the facet joints bilaterally. C5-6 moderate narrowing of the interspace with posterior protrusion. C6-7 moderate narrowing of the interspace Upper chest: Negative. Other: Streak artifact from dental restorations. IMPRESSION: 1. Negative for bleed or other acute intracranial process. 2. 2 cm mass in the right middle cranial fossa, new since 2007. Differential diagnosis includes benign meningioma, less likely intra-axial neoplasm or atypical aneurysm. Recommend elective MR head with contrast for further characterization. 3. Negative for cervical fracture or dislocation. 4. Multilevel cervical spondylitic changes as enumerated above. Electronically Signed   By: Lucrezia Europe M.D.   On: 06/21/2016 09:41   Ct Cervical Spine Wo Contrast  Result Date: 06/21/2016 CLINICAL DATA:  Pt came to ED via EMS from home. Pt has unwitnessed fall, pt does not remember falling. Thinks she slipped out the end of her bed. Reports right hip pain. Not on blood thinners. EXAM: CT HEAD WITHOUT CONTRAST CT CERVICAL SPINE WITHOUT CONTRAST TECHNIQUE: Multidetector CT imaging of the head and  cervical spine was performed following the standard protocol without intravenous contrast. Multiplanar CT image reconstructions of the cervical spine were also generated. COMPARISON:  04/24/2005 FINDINGS: CT HEAD FINDINGS Brain: 2 cm hyperdense lesion in the right middle cranial fossa abutting the tentorium and involving the temporal lobe. Lesion not evident on prior study from 2007. No definite dural tail. No acute intracranial hemorrhage, midline shift, or focal parenchymal edema. Acute infarct may be inapparent on noncontrast CT. Mild diffuse parenchymal atrophy. Patchy areas of hypoattenuation in deep and periventricular white matter bilaterally. Ventricles and sulci normal in size and symmetry. Vascular: Atherosclerotic and physiologic intracranial calcifications. Skull: Normal. Negative for fracture or focal lesion. Sinuses/Orbits: No acute finding. Other: None. CT CERVICAL SPINE FINDINGS Alignment: Normal. Skull base and vertebrae: No acute fracture. No primary bone lesion or focal pathologic process. Soft tissues and spinal canal: No prevertebral fluid or swelling. No visible canal hematoma. Bilateral carotid arterial calcifications. Disc levels: C2-3 mild facet DJD right greater than left C3-4 mild narrowing of the interspace. Facet and uncovertebral DJD resulting in foraminal stenosis right worse than left. C4-5 Fusion across the facet joints bilaterally. C5-6 moderate narrowing of the interspace with posterior protrusion. C6-7 moderate narrowing of the interspace Upper chest: Negative. Other: Streak artifact from dental restorations. IMPRESSION: 1. Negative for bleed or other acute intracranial process. 2. 2 cm mass in the right middle cranial fossa, new since 2007. Differential diagnosis includes benign meningioma, less likely intra-axial neoplasm or atypical aneurysm. Recommend elective MR head with contrast for further characterization. 3. Negative for cervical fracture or dislocation. 4. Multilevel  cervical spondylitic changes as enumerated above. Electronically Signed   By: Lucrezia Europe M.D.   On: 06/21/2016 09:41   Dg Chest Portable 1 View  Result Date: 06/21/2016 CLINICAL DATA:  Syncope, fell EXAM: PORTABLE CHEST - 1 VIEW COMPARISON:  04/24/2005 FINDINGS: Large hiatal hernia, limiting evaluation of the lung bases. Mild pulmonary vascular congestion. Stable cardiomegaly.  Atheromatous aorta. Small right pleural effusion.  No pneumothorax. Mild thoracic dextroscoliosis. IMPRESSION: 1. Large hiatal hernia. 2. Small right pleural effusion. 3. No pneumothorax. 4. Central pulmonary vascular congestion, new since previous exam Electronically Signed   By: Lucrezia Europe M.D.   On: 06/21/2016 10:32   Dg Hip Operative Unilat W Or W/o Pelvis Right  Result Date: 06/21/2016  CLINICAL DATA:  Intraoperative repair hip fracture. EXAM: OPERATIVE RIGHT HIP (WITH PELVIS IF PERFORMED) 2 VIEWS TECHNIQUE: Fluoroscopic spot image(s) were submitted for interpretation post-operatively. COMPARISON:  None. FINDINGS: Three screws now cross the right hip fracture. IMPRESSION: Right hip fracture repair as above. Electronically Signed   By: Dorise Bullion III M.D   On: 06/21/2016 17:22   Dg Hip Unilat W Or Wo Pelvis 2-3 Views Right  Result Date: 06/21/2016 CLINICAL DATA:  Pt had unwitnessed fall last night. Right hip pain since. No prior injury. EXAM: DG HIP (WITH OR WITHOUT PELVIS) 2-3V RIGHT COMPARISON:  None. FINDINGS: Impacted subcapital fracture, right femur. No dislocation. Diffuse osteopenia. Bony pelvis intact. Question of old fracture deformity, left iliac wing. Left pelvic phleboliths. IMPRESSION: Impacted right femoral subcapital fracture. Electronically Signed   By: Lucrezia Europe M.D.   On: 06/21/2016 09:18    Assessment/Plan 1. Pneumonia of both lower lobes due to infectious organism ([place])  Levequin 750 mg po EOD x 7 days  2. Closed fracture of right hip with routine healing, subsequent encounter  Continue  Tylenol 650 mg po Q 4 hours prn pain  Tramadol 50 mg po Q 4 hours prn pain- minimize use  3. Chronic midline low back pain without sciatica  Aspercreme Lidocaine 4% patch- apply 1 patch to lower back Q Day, remove after 12 hours  4. Somnolence  Decrease HS Alprazolam to 0.25 mg  Do not give Tramadol and alprazolam at the same time  Family/ staff Communication:   Total Time:  Documentation:  Face to Face:  Family/Phone:   Labs/tests ordered:  2-V CXR, CBC, Met C  Medication list reviewed and assessed for continued appropriateness.  Vikki Ports, NP-C [facility] [phone], [place] [address] Cell Phone (Mon-Fri 8am-5pm):  [phone] On Call:  [phone] & follow prompts after 5pm & weekends Office Phone:  [phone] Office Fax:  [phone]

## 2016-06-30 ENCOUNTER — Non-Acute Institutional Stay (SKILLED_NURSING_FACILITY): Payer: Medicare Other | Admitting: Gerontology

## 2016-06-30 DIAGNOSIS — R400 Somnolence: Secondary | ICD-10-CM | POA: Diagnosis not present

## 2016-06-30 DIAGNOSIS — J189 Pneumonia, unspecified organism: Secondary | ICD-10-CM

## 2016-06-30 DIAGNOSIS — M545 Low back pain: Secondary | ICD-10-CM

## 2016-06-30 DIAGNOSIS — Z515 Encounter for palliative care: Secondary | ICD-10-CM | POA: Diagnosis not present

## 2016-06-30 DIAGNOSIS — J181 Lobar pneumonia, unspecified organism: Secondary | ICD-10-CM | POA: Diagnosis not present

## 2016-06-30 DIAGNOSIS — S72001D Fracture of unspecified part of neck of right femur, subsequent encounter for closed fracture with routine healing: Secondary | ICD-10-CM

## 2016-06-30 DIAGNOSIS — G8929 Other chronic pain: Secondary | ICD-10-CM | POA: Diagnosis not present

## 2016-06-30 LAB — GLUCOSE, CAPILLARY
GLUCOSE-CAPILLARY: 197 mg/dL — AB (ref 65–99)
GLUCOSE-CAPILLARY: 119 mg/dL — AB (ref 65–99)
GLUCOSE-CAPILLARY: 166 mg/dL — AB (ref 65–99)
GLUCOSE-CAPILLARY: 143 mg/dL — AB (ref 65–99)
GLUCOSE-CAPILLARY: 159 mg/dL — AB (ref 65–99)
GLUCOSE-CAPILLARY: 161 mg/dL — AB (ref 65–99)
GLUCOSE-CAPILLARY: 99 mg/dL (ref 65–99)
Glucose-Capillary: 155 mg/dL — ABNORMAL HIGH (ref 65–99)
Glucose-Capillary: 110 mg/dL — ABNORMAL HIGH (ref 65–99)
Glucose-Capillary: 107 mg/dL — ABNORMAL HIGH (ref 65–99)
Glucose-Capillary: 119 mg/dL — ABNORMAL HIGH (ref 65–99)
Glucose-Capillary: 133 mg/dL — ABNORMAL HIGH (ref 65–99)
Glucose-Capillary: 152 mg/dL — ABNORMAL HIGH (ref 65–99)
Glucose-Capillary: 108 mg/dL — ABNORMAL HIGH (ref 65–99)

## 2016-06-30 NOTE — Progress Notes (Signed)
Location:      Place of Service:  SNF (31) Provider:  Toni Arthurs, NP-C  Cletis Athens, MD  Patient Care Team: Cletis Athens, MD as PCP - General (Internal Medicine)  Extended Emergency Contact Information Primary Emergency Contact: Samantha Crimes Address: [address]          [place], [place] [address] Home Phone: [phone] Work Phone: [phone] Relation: None  Code Status:  DNR Goals of care: Advanced Directive information Advanced Directives 06/21/2016  Does Patient Have a Medical Advance Directive? No  Would patient like information on creating a medical advance directive? No - Patient declined     Chief Complaint  Patient presents with  . Follow-up    HPI:  Pt is a 83 y.o. female seen today for a follow up visit for Pneumonia, somnolence, back pain and hip fracture. Pt was having cough, congestion, low O2 sats. Family described intermittent periods of gasping for air. Family reports she was not on oxygen at home prior to entering the hospital for Right hip fracture with subsequent UTI/ encephalopathy.  Pt was discharged on po Keflex. Family reports she has had increased somnolence since the hospitalization. She has not had pain meds, etc in over 18 hours and somnolence continues. She no longer will arouse to name call and tactile stimulation, but minimally flinches to sternal rub. Pt has minimal air movement in BLL. Family reports pt has chronic lower back pain. Family has applied an OTC pain patch (at home) and pt reports improvement in symptoms. Hip dressing CDI. Incision well approximated. Last week, I discussed with family the high potential for poor outcomes d/t injury and co-morbidities, despite pt's strong/stubborn nature. We discussed statistical probability of high mortality rate. Family verbalized understanding. Family was agreeable to a Palliative Care Consult. Today, daughter, son-in-law and myself discussed the findings of significant decline, not  responding to antibiotics or IVF. Non-responsiveness. Pt stopped eating and drinking more than bites since admission, but nothing today. After discussion, family decided on comfort measures only and to stop the IVF, IV abt and stop therapies. Pt will remain in the facility and will begin Hospice services as soon as insurance stops. Will continue to follow closely. VSS. No other complaints.    Past Medical History:  Diagnosis Date  . Arthritis   . HLD (hyperlipidemia)   . HTN (hypertension)   . Hypothyroidism   . Urinary frequency    Past Surgical History:  Procedure Laterality Date  . HIP PINNING,CANNULATED Right 06/21/2016   Procedure: CANNULATED HIP PINNING;  Surgeon: Claud Kelp, MD;  Location: [facility] ORS;  Service: Orthopedics;  Laterality: Right;  . INCONTINENCE SURGERY      No Known Allergies  Allergies as of 06/30/2016   No Known Allergies     Medication List       Accurate as of 06/30/16  9:33 PM. Always use your most recent med list.          acetaminophen 650 MG suppository Commonly known as:  TYLENOL Place 1 suppository (650 mg total) rectally every 6 (six) hours as needed for mild pain (or Fever >/= 101).   ALPRAZolam 0.5 MG tablet Commonly known as:  XANAX Take 1 tablet (0.5 mg total) by mouth at bedtime.   amLODipine 2.5 MG tablet Commonly known as:  NORVASC Take 1 tablet by mouth daily.   aspirin EC 81 MG tablet Take 81 mg by mouth at bedtime.   atorvastatin 40 MG tablet Commonly known as:  LIPITOR Take  40 mg by mouth at bedtime.   cephALEXin 500 MG capsule Commonly known as:  KEFLEX Take 1 capsule (500 mg total) by mouth 2 (two) times daily.   cholecalciferol 1000 units tablet Commonly known as:  VITAMIN D Take 4,000 Units by mouth daily.   Co Q-10 200 MG Caps Take 1 capsule by mouth daily.   docusate sodium 100 MG capsule Commonly known as:  COLACE Take 1 capsule (100 mg total) by mouth daily.   enoxaparin 30 MG/0.3ML  injection Commonly known as:  LOVENOX Inject 0.3 mLs (30 mg total) into the skin daily.   levothyroxine 100 MCG tablet Commonly known as:  SYNTHROID, LEVOTHROID Take 100 mcg by mouth daily before breakfast.   lisinopril 2.5 MG tablet Commonly known as:  PRINIVIL,ZESTRIL Take 2.5 mg by mouth daily.   metFORMIN 1000 MG tablet Commonly known as:  GLUCOPHAGE Take 1,000 mg by mouth 2 (two) times daily with a meal.   multivitamin tablet Take 1 tablet by mouth daily.   traMADol 50 MG tablet Commonly known as:  ULTRAM Take 1 tablet (50 mg total) by mouth every 6 (six) hours as needed for moderate pain.   vitamin C 1000 MG tablet Take 1,000 mg by mouth daily.       Review of Systems  Unable to perform ROS: Mental status change  Constitutional: Positive for activity change and fatigue. Negative for appetite change, chills, diaphoresis and fever.  HENT: Negative for congestion, sneezing, sore throat, trouble swallowing and voice change.   Respiratory: Positive for cough and shortness of breath. Negative for apnea, choking, chest tightness and wheezing.   Cardiovascular: Negative for chest pain, palpitations and leg swelling.  Gastrointestinal: Negative for abdominal distention, abdominal pain, constipation, diarrhea and nausea.  Genitourinary: Negative for difficulty urinating, dysuria, frequency and urgency.  Musculoskeletal: Positive for arthralgias (typical arthritis), gait problem and joint swelling. Negative for back pain and myalgias.  Skin: Positive for wound. Negative for color change, pallor and rash.  Neurological: Positive for weakness. Negative for dizziness, tremors, syncope, speech difficulty, numbness and headaches.  Psychiatric/Behavioral: Negative for agitation and behavioral problems.  All other systems reviewed and are negative.    There is no immunization history on file for this patient. Pertinent  Health Maintenance Due  Topic Date Due  . DEXA SCAN   03-29-1982  . PNA vac Low Risk Adult (1 of 2 - PCV13) 03-29-1982  . INFLUENZA VACCINE  09/02/2016   No flowsheet data found. Functional Status Survey:    Vitals:   06/30/16 0700  BP: (!) 146/60  Pulse: 82  Resp: 19  Temp: 97.7 F (36.5 C)  SpO2: 99%   There is no height or weight on file to calculate BMI. Physical Exam  Constitutional: She is oriented to person, place, and time. Vital signs are normal. She appears well-developed and well-nourished. She is active and cooperative. She appears ill. No distress. Nasal cannula in place.  HENT:  Head: Normocephalic and atraumatic.  Mouth/Throat: Uvula is midline, oropharynx is clear and moist and mucous membranes are normal. Mucous membranes are not pale, not dry and not cyanotic.  Eyes: Conjunctivae, EOM and lids are normal. Pupils are equal, round, and reactive to light.  Neck: Trachea normal, normal range of motion and full passive range of motion without pain. Neck supple. No JVD present. No tracheal deviation, no edema and no erythema present. No thyromegaly present.  Cardiovascular: Normal rate, intact distal pulses and normal pulses.  An irregular rhythm present.  Exam reveals no gallop, no distant heart sounds and no friction rub.   Murmur heard. Pulses:      Dorsalis pedis pulses are 2+ on the right side, and 2+ on the left side.  Pulmonary/Chest: Effort normal. No accessory muscle usage. No respiratory distress. She has decreased breath sounds (shallow respirations) in the right lower field and the left lower field. She has no wheezes. She has no rhonchi. She has no rales. She exhibits no tenderness.  Abdominal: Soft. Normal appearance. She exhibits no distension and no ascites. Bowel sounds are decreased. There is no tenderness.  Musculoskeletal: She exhibits no edema.       Right hip: She exhibits decreased range of motion, decreased strength, tenderness and laceration.  Expected osteoarthritis, stiffness; calves soft, supple.  Negative Homan's sign  Neurological: She is oriented to person, place, and time. She has normal strength. She is unresponsive.  Skin: Skin is warm and dry. Laceration (right hip incision) noted. She is not diaphoretic. No cyanosis. No pallor. Nails show no clubbing.  Psychiatric: She has a normal mood and affect. Her speech is normal and behavior is normal. Judgment and thought content normal. Cognition and memory are normal.  Nursing note and vitals reviewed.   Labs reviewed:  Recent Labs  06/22/16 0425 06/23/16 0422 06/25/16 1338  NA 139 139 134*  K 3.4* 3.9 4.7  CL 103 103 96*  CO2 28 26 32  GLUCOSE 163* 151* 125*  BUN _0 CREATININE 0.75 0.62 0.50  CALCIUM 8.8* 9.3 9.5    Recent Labs  06/21/16 0835 06/22/16 0425 06/25/16 1338  AST _1 ALT 19 15 11*  ALKPHOS 125 109 97  BILITOT 0.7 0.9 0.6  PROT 7.3 6.8 6.7  ALBUMIN 3.7 3.3* 2.9*    Recent Labs  06/21/16 0835 06/22/16 0425 06/25/16 1338  WBC 15.3* 15.6* 11.0  NEUTROABS 12.4*  --  8.2*  HGB 12.6 12.0 12.1  HCT 37.5 36.4 36.5  MCV 99.0 99.8 100.3*  PLT 235 216 244   Lab Results  Component Value Date   TSH 0.985 06/22/2016   Lab Results  Component Value Date   HGBA1C 8.1 (H) 06/22/2016   Lab Results  Component Value Date   CHOL 130 06/22/2016   HDL 71 06/22/2016   LDLCALC 47 06/22/2016   TRIG 59 06/22/2016   CHOLHDL 1.8 06/22/2016    Significant Diagnostic Results in last 30 days:  Ct Head Wo Contrast  Result Date: 06/21/2016 CLINICAL DATA:  Pt came to ED via EMS from home. Pt has unwitnessed fall, pt does not remember falling. Thinks she slipped out the end of her bed. Reports right hip pain. Not on blood thinners. EXAM: CT HEAD WITHOUT CONTRAST CT CERVICAL SPINE WITHOUT CONTRAST TECHNIQUE: Multidetector CT imaging of the head and cervical spine was performed following the standard protocol without intravenous contrast. Multiplanar CT image reconstructions of the cervical spine were  also generated. COMPARISON:  04/24/2005 FINDINGS: CT HEAD FINDINGS Brain: 2 cm hyperdense lesion in the right middle cranial fossa abutting the tentorium and involving the temporal lobe. Lesion not evident on prior study from 2007. No definite dural tail. No acute intracranial hemorrhage, midline shift, or focal parenchymal edema. Acute infarct may be inapparent on noncontrast CT. Mild diffuse parenchymal atrophy. Patchy areas of hypoattenuation in deep and periventricular white matter bilaterally. Ventricles and sulci normal in size and symmetry. Vascular: Atherosclerotic and physiologic intracranial calcifications. Skull: Normal. Negative for fracture or focal  lesion. Sinuses/Orbits: No acute finding. Other: None. CT CERVICAL SPINE FINDINGS Alignment: Normal. Skull base and vertebrae: No acute fracture. No primary bone lesion or focal pathologic process. Soft tissues and spinal canal: No prevertebral fluid or swelling. No visible canal hematoma. Bilateral carotid arterial calcifications. Disc levels: C2-3 mild facet DJD right greater than left C3-4 mild narrowing of the interspace. Facet and uncovertebral DJD resulting in foraminal stenosis right worse than left. C4-5 Fusion across the facet joints bilaterally. C5-6 moderate narrowing of the interspace with posterior protrusion. C6-7 moderate narrowing of the interspace Upper chest: Negative. Other: Streak artifact from dental restorations. IMPRESSION: 1. Negative for bleed or other acute intracranial process. 2. 2 cm mass in the right middle cranial fossa, new since 2007. Differential diagnosis includes benign meningioma, less likely intra-axial neoplasm or atypical aneurysm. Recommend elective MR head with contrast for further characterization. 3. Negative for cervical fracture or dislocation. 4. Multilevel cervical spondylitic changes as enumerated above. Electronically Signed   By: Lucrezia Europe M.D.   On: 06/21/2016 09:41   Ct Cervical Spine Wo  Contrast  Result Date: 06/21/2016 CLINICAL DATA:  Pt came to ED via EMS from home. Pt has unwitnessed fall, pt does not remember falling. Thinks she slipped out the end of her bed. Reports right hip pain. Not on blood thinners. EXAM: CT HEAD WITHOUT CONTRAST CT CERVICAL SPINE WITHOUT CONTRAST TECHNIQUE: Multidetector CT imaging of the head and cervical spine was performed following the standard protocol without intravenous contrast. Multiplanar CT image reconstructions of the cervical spine were also generated. COMPARISON:  04/24/2005 FINDINGS: CT HEAD FINDINGS Brain: 2 cm hyperdense lesion in the right middle cranial fossa abutting the tentorium and involving the temporal lobe. Lesion not evident on prior study from 2007. No definite dural tail. No acute intracranial hemorrhage, midline shift, or focal parenchymal edema. Acute infarct may be inapparent on noncontrast CT. Mild diffuse parenchymal atrophy. Patchy areas of hypoattenuation in deep and periventricular white matter bilaterally. Ventricles and sulci normal in size and symmetry. Vascular: Atherosclerotic and physiologic intracranial calcifications. Skull: Normal. Negative for fracture or focal lesion. Sinuses/Orbits: No acute finding. Other: None. CT CERVICAL SPINE FINDINGS Alignment: Normal. Skull base and vertebrae: No acute fracture. No primary bone lesion or focal pathologic process. Soft tissues and spinal canal: No prevertebral fluid or swelling. No visible canal hematoma. Bilateral carotid arterial calcifications. Disc levels: C2-3 mild facet DJD right greater than left C3-4 mild narrowing of the interspace. Facet and uncovertebral DJD resulting in foraminal stenosis right worse than left. C4-5 Fusion across the facet joints bilaterally. C5-6 moderate narrowing of the interspace with posterior protrusion. C6-7 moderate narrowing of the interspace Upper chest: Negative. Other: Streak artifact from dental restorations. IMPRESSION: 1. Negative for  bleed or other acute intracranial process. 2. 2 cm mass in the right middle cranial fossa, new since 2007. Differential diagnosis includes benign meningioma, less likely intra-axial neoplasm or atypical aneurysm. Recommend elective MR head with contrast for further characterization. 3. Negative for cervical fracture or dislocation. 4. Multilevel cervical spondylitic changes as enumerated above. Electronically Signed   By: Lucrezia Europe M.D.   On: 06/21/2016 09:41   Dg Chest Portable 1 View  Result Date: 06/21/2016 CLINICAL DATA:  Syncope, fell EXAM: PORTABLE CHEST - 1 VIEW COMPARISON:  04/24/2005 FINDINGS: Large hiatal hernia, limiting evaluation of the lung bases. Mild pulmonary vascular congestion. Stable cardiomegaly.  Atheromatous aorta. Small right pleural effusion.  No pneumothorax. Mild thoracic dextroscoliosis. IMPRESSION: 1. Large hiatal hernia. 2. Small right  pleural effusion. 3. No pneumothorax. 4. Central pulmonary vascular congestion, new since previous exam Electronically Signed   By: Lucrezia Europe M.D.   On: 06/21/2016 10:32   Dg Hip Operative Unilat W Or W/o Pelvis Right  Result Date: 06/21/2016 CLINICAL DATA:  Intraoperative repair hip fracture. EXAM: OPERATIVE RIGHT HIP (WITH PELVIS IF PERFORMED) 2 VIEWS TECHNIQUE: Fluoroscopic spot image(s) were submitted for interpretation post-operatively. COMPARISON:  None. FINDINGS: Three screws now cross the right hip fracture. IMPRESSION: Right hip fracture repair as above. Electronically Signed   By: Dorise Bullion III M.D   On: 06/21/2016 17:22   Dg Hip Unilat W Or Wo Pelvis 2-3 Views Right  Result Date: 06/21/2016 CLINICAL DATA:  Pt had unwitnessed fall last night. Right hip pain since. No prior injury. EXAM: DG HIP (WITH OR WITHOUT PELVIS) 2-3V RIGHT COMPARISON:  None. FINDINGS: Impacted subcapital fracture, right femur. No dislocation. Diffuse osteopenia. Bony pelvis intact. Question of old fracture deformity, left iliac wing. Left pelvic  phleboliths. IMPRESSION: Impacted right femoral subcapital fracture. Electronically Signed   By: Lucrezia Europe M.D.   On: 06/21/2016 09:18    Assessment/Plan 1. Pneumonia of both lower lobes due to infectious organism ([place])  D/C Levequin 750 mg po EOD x 7 days  Rocephin 2 grams IV Q Day x 7 days- Discontinue  0.9% NS- 250 mL bolus over 1 hour, then continuous at 75 ml/ hr- Discontinue  2. Closed fracture of right hip with routine healing, subsequent encounter  Continue Tylenol 650 mg po Q 4 hours prn pain  Tramadol 50 mg po Q 4 hours prn pain- Discontinue  3. Chronic midline low back pain without sciatica  Aspercreme Lidocaine 4% patch- apply 1 patch to lower back Q Day, remove after 12 hours  4. Somnolence  Decrease HS Alprazolam to 0.25 mg- Discontinue  Do not give Tramadol and alprazolam at the same time  5. Encounter for Dying Care  Family Meeting for [place]  DNR  DC all antibiotics, IVF, all non-comfort meds  Continue Oxygen 2-5 liters/ minute- titrate for comfort  Morphine concentrate 20 mg/ mL 0.25-0.5 ml po Q 1 hour prn pain, dyspnea  Alprazolam 0.25 mg po Q 6 hours prn anxiety, agitation, restlessness  Atropine 1% drops- 2 drops under the tongue Q 30 minutes prn secretions  Insert Foley for comfort  Hospice referral for EOL care in facility  Have accounting services speak with family about financial options  Family/ staff Communication:   Total Time:  Documentation:  Face to Face: 15 minutes  Family/Phone: 45 minutes- Advanced Care Plan Meeting   Labs/tests ordered:  2-V CXR, CBC, Met C  Medication list reviewed and assessed for continued appropriateness.  Vikki Ports, NP-C Geriatrics [facility] [phone], [place] [address] Cell Phone (Mon-Fri 8am-5pm):  [phone] On Call:  [phone] & follow prompts after 5pm & weekends Office Phone:  [phone] Office Fax:   [phone]

## 2016-07-01 ENCOUNTER — Non-Acute Institutional Stay (SKILLED_NURSING_FACILITY): Payer: Medicare Other | Admitting: Gerontology

## 2016-07-01 DIAGNOSIS — G8929 Other chronic pain: Secondary | ICD-10-CM | POA: Diagnosis not present

## 2016-07-01 DIAGNOSIS — Z515 Encounter for palliative care: Secondary | ICD-10-CM

## 2016-07-01 DIAGNOSIS — J181 Lobar pneumonia, unspecified organism: Secondary | ICD-10-CM

## 2016-07-01 DIAGNOSIS — R400 Somnolence: Secondary | ICD-10-CM

## 2016-07-01 DIAGNOSIS — M545 Low back pain: Secondary | ICD-10-CM

## 2016-07-01 DIAGNOSIS — S72001D Fracture of unspecified part of neck of right femur, subsequent encounter for closed fracture with routine healing: Secondary | ICD-10-CM

## 2016-07-01 DIAGNOSIS — J189 Pneumonia, unspecified organism: Secondary | ICD-10-CM

## 2016-07-01 DIAGNOSIS — R05 Cough: Secondary | ICD-10-CM | POA: Diagnosis not present

## 2016-07-01 LAB — GLUCOSE, CAPILLARY
GLUCOSE-CAPILLARY: 81 mg/dL (ref 65–99)
Glucose-Capillary: 118 mg/dL — ABNORMAL HIGH (ref 65–99)
Glucose-Capillary: 125 mg/dL — ABNORMAL HIGH (ref 65–99)
Glucose-Capillary: 149 mg/dL — ABNORMAL HIGH (ref 65–99)
Glucose-Capillary: 154 mg/dL — ABNORMAL HIGH (ref 65–99)

## 2016-07-01 NOTE — Progress Notes (Signed)
Location:      Place of Service:  SNF (31) Provider:  Lorenso Quarry, NP-C  Corky Downs, MD  Patient Care Team: Corky Downs, MD as PCP - General (Internal Medicine)  Extended Emergency Contact Information Primary Emergency Contact: Rogelia Boga Address: [address]man          [place], [place] [address] Home Phone: [phone] Work Phone: [phone] Relation: None  Code Status:  DNR Goals of care: Advanced Directive information Advanced Directives 06/21/2016  Does Patient Have a Medical Advance Directive? No  Would patient like information on creating a medical advance directive? No - Patient declined     Chief Complaint  Patient presents with  . Follow-up    HPI:  Kelly Caldwell is a 83 y.o. female seen today for a follow up visit for Pneumonia, somnolence, back pain and hip fracture. Kelly Caldwell was having cough, congestion, low O2 sats. Family described intermittent periods of gasping for air. Family reports she was not on oxygen at home prior to entering the hospital for Right hip fracture with subsequent UTI/ encephalopathy.  Kelly Caldwell was discharged on po Keflex. Family reports she has had increased somnolence since the hospitalization. She has not had pain meds, etc in over 18 hours and somnolence continues. She no longer will arouse to name call and tactile stimulation, but minimally flinches to sternal rub. Kelly Caldwell has minimal air movement in BLL. Family reports Kelly Caldwell has chronic lower back pain. Family has applied an OTC pain patch (at home) and Kelly Caldwell reports improvement in symptoms. Hip dressing CDI. Incision well approximated. Last week, I discussed with family the high potential for poor outcomes d/t injury and co-morbidities, despite Kelly Caldwell's strong/stubborn nature. We discussed statistical probability of high mortality rate. Family verbalized understanding. Family was agreeable to a Palliative Care Consult. Yesterday, daughter, son-in-law and myself discussed the findings of significant decline, not  responding to antibiotics or IVF. Non-responsiveness. Kelly Caldwell stopped eating and drinking more than bites since admission. After discussion, family decided on comfort measures only and to stop the IVF, IV abt and stop therapies. Kelly Caldwell will remain in the facility and will begin Hospice services as soon as insurance stops. TODAY, Kelly Caldwell is awake, alert, slightly confused. Family reports she ate a good meal last night and this morning. Family is now worried they made the wrong decision by stopping abt, etc early. We again had a long discussion about this possibly being a "rally" vs. Abt starting work work, Catering manager. We decided to allow rehab to continue, not start Hospice yet- we will give it through the weekend to see how she does now. Palliative to still see Kelly Caldwell. Restart abt, but continue comfort meds/ comfort path. Will continue to follow closely. VSS. No other complaints.    Past Medical History:  Diagnosis Date  . Arthritis   . HLD (hyperlipidemia)   . HTN (hypertension)   . Hypothyroidism   . Urinary frequency    Past Surgical History:  Procedure Laterality Date  . HIP PINNING,CANNULATED Right 06/21/2016   Procedure: CANNULATED HIP PINNING;  Surgeon: Danelle Earthly, MD;  Location: [facility] ORS;  Service: Orthopedics;  Laterality: Right;  . INCONTINENCE SURGERY      No Known Allergies  Allergies as of 07/01/2016   No Known Allergies     Medication List       Accurate as of 07/01/16  3:53 PM. Always use your most recent med list.          acetaminophen 650 MG suppository Commonly known as:  TYLENOL Place 1 suppository (650 mg total) rectally every 6 (six) hours as needed for mild pain (or Fever >/= 101).   ALPRAZolam 0.5 MG tablet Commonly known as:  XANAX Take 1 tablet (0.5 mg total) by mouth at bedtime.   amLODipine 2.5 MG tablet Commonly known as:  NORVASC Take 1 tablet by mouth daily.   aspirin EC 81 MG tablet Take 81 mg by mouth at bedtime.   atorvastatin 40 MG tablet Commonly known as:   LIPITOR Take 40 mg by mouth at bedtime.   cephALEXin 500 MG capsule Commonly known as:  KEFLEX Take 1 capsule (500 mg total) by mouth 2 (two) times daily.   cholecalciferol 1000 units tablet Commonly known as:  VITAMIN D Take 4,000 Units by mouth daily.   Co Q-10 200 MG Caps Take 1 capsule by mouth daily.   docusate sodium 100 MG capsule Commonly known as:  COLACE Take 1 capsule (100 mg total) by mouth daily.   enoxaparin 30 MG/0.3ML injection Commonly known as:  LOVENOX Inject 0.3 mLs (30 mg total) into the skin daily.   levothyroxine 100 MCG tablet Commonly known as:  SYNTHROID, LEVOTHROID Take 100 mcg by mouth daily before breakfast.   lisinopril 2.5 MG tablet Commonly known as:  PRINIVIL,ZESTRIL Take 2.5 mg by mouth daily.   metFORMIN 1000 MG tablet Commonly known as:  GLUCOPHAGE Take 1,000 mg by mouth 2 (two) times daily with a meal.   multivitamin tablet Take 1 tablet by mouth daily.   traMADol 50 MG tablet Commonly known as:  ULTRAM Take 1 tablet (50 mg total) by mouth every 6 (six) hours as needed for moderate pain.   vitamin C 1000 MG tablet Take 1,000 mg by mouth daily.       Review of Systems  Constitutional: Positive for activity change and fatigue. Negative for appetite change, chills, diaphoresis and fever.  HENT: Negative for congestion, sneezing, sore throat, trouble swallowing and voice change.   Respiratory: Positive for cough and shortness of breath. Negative for apnea, choking, chest tightness and wheezing.   Cardiovascular: Negative for chest pain, palpitations and leg swelling.  Gastrointestinal: Negative for abdominal distention, abdominal pain, constipation, diarrhea and nausea.  Genitourinary: Negative for difficulty urinating, dysuria, frequency and urgency.  Musculoskeletal: Positive for arthralgias (typical arthritis), gait problem and joint swelling. Negative for back pain and myalgias.  Skin: Positive for wound. Negative for color  change, pallor and rash.  Neurological: Positive for weakness. Negative for dizziness, tremors, syncope, speech difficulty, numbness and headaches.  Psychiatric/Behavioral: Negative for agitation and behavioral problems.  All other systems reviewed and are negative.    There is no immunization history on file for this patient. Pertinent  Health Maintenance Due  Topic Date Due  . DEXA SCAN  12-15-1982  . PNA vac Low Risk Adult (1 of 2 - PCV13) 12-15-1982  . INFLUENZA VACCINE  09/02/2016   No flowsheet data found. Functional Status Survey:    Vitals:   07/01/16 0530  BP: (!) 156/50  Pulse: 79  Resp: 16  Temp: 98.6 F (37 C)  SpO2: 96%   There is no height or weight on file to calculate BMI. Physical Exam  Constitutional: She is oriented to person, place, and time. Vital signs are normal. She appears well-developed and well-nourished. She is active and cooperative. She appears ill. No distress. Nasal cannula in place.  HENT:  Head: Normocephalic and atraumatic.  Mouth/Throat: Uvula is midline, oropharynx is clear and moist and mucous  membranes are normal. Mucous membranes are not pale, not dry and not cyanotic.  Eyes: Conjunctivae, EOM and lids are normal. Pupils are equal, round, and reactive to light.  Neck: Trachea normal, normal range of motion and full passive range of motion without pain. Neck supple. No JVD present. No tracheal deviation, no edema and no erythema present. No thyromegaly present.  Cardiovascular: Normal rate, intact distal pulses and normal pulses.  An irregular rhythm present. Exam reveals no gallop, no distant heart sounds and no friction rub.   Murmur heard. Pulses:      Dorsalis pedis pulses are 2+ on the right side, and 2+ on the left side.  Pulmonary/Chest: Effort normal. No accessory muscle usage. No respiratory distress. She has decreased breath sounds (shallow respirations) in the right lower field and the left lower field. She has no wheezes. She  has no rhonchi. She has no rales. She exhibits no tenderness.  Abdominal: Soft. Normal appearance. She exhibits no distension and no ascites. Bowel sounds are decreased. There is no tenderness.  Musculoskeletal: She exhibits no edema.       Right hip: She exhibits decreased range of motion, decreased strength, tenderness and laceration.  Expected osteoarthritis, stiffness; calves soft, supple. Negative Homan's sign  Neurological: She is alert and oriented to person, place, and time. She has normal strength.  Skin: Skin is warm and dry. Laceration (right hip incision) noted. No rash noted. She is not diaphoretic. No cyanosis or erythema. No pallor. Nails show no clubbing.  Psychiatric: She has a normal mood and affect. Her speech is normal and behavior is normal. Judgment and thought content normal. Cognition and memory are normal.  Nursing note and vitals reviewed.   Labs reviewed:  Recent Labs  06/22/16 0425 06/23/16 0422 06/25/16 1338  NA 139 139 134*  K 3.4* 3.9 4.7  CL 103 103 96*  CO2 28 26 32  GLUCOSE 163* 151* 125*  BUN 8 13 17   CREATININE 0.75 0.62 0.50  CALCIUM 8.8* 9.3 9.5    Recent Labs  06/21/16 0835 06/22/16 0425 06/25/16 1338  AST 31 23 19   ALT 19 15 11*  ALKPHOS 125 109 97  BILITOT 0.7 0.9 0.6  PROT 7.3 6.8 6.7  ALBUMIN 3.7 3.3* 2.9*    Recent Labs  06/21/16 0835 06/22/16 0425 06/25/16 1338  WBC 15.3* 15.6* 11.0  NEUTROABS 12.4*  --  8.2*  HGB 12.6 12.0 12.1  HCT 37.5 36.4 36.5  MCV 99.0 99.8 100.3*  PLT 235 216 244   Lab Results  Component Value Date   TSH 0.985 06/22/2016   Lab Results  Component Value Date   HGBA1C 8.1 (H) 06/22/2016   Lab Results  Component Value Date   CHOL 130 06/22/2016   HDL 71 06/22/2016   LDLCALC 47 06/22/2016   TRIG 59 06/22/2016   CHOLHDL 1.8 06/22/2016    Significant Diagnostic Results in last 30 days:  Ct Head Wo Contrast  Result Date: 06/21/2016 CLINICAL DATA:  Kelly Caldwell came to ED via EMS from home. Kelly Caldwell  has unwitnessed fall, Kelly Caldwell does not remember falling. Thinks she slipped out the end of her bed. Reports right hip pain. Not on blood thinners. EXAM: CT HEAD WITHOUT CONTRAST CT CERVICAL SPINE WITHOUT CONTRAST TECHNIQUE: Multidetector CT imaging of the head and cervical spine was performed following the standard protocol without intravenous contrast. Multiplanar CT image reconstructions of the cervical spine were also generated. COMPARISON:  04/24/2005 FINDINGS: CT HEAD FINDINGS Brain: 2 cm hyperdense  lesion in the right middle cranial fossa abutting the tentorium and involving the temporal lobe. Lesion not evident on prior study from 2007. No definite dural tail. No acute intracranial hemorrhage, midline shift, or focal parenchymal edema. Acute infarct may be inapparent on noncontrast CT. Mild diffuse parenchymal atrophy. Patchy areas of hypoattenuation in deep and periventricular white matter bilaterally. Ventricles and sulci normal in size and symmetry. Vascular: Atherosclerotic and physiologic intracranial calcifications. Skull: Normal. Negative for fracture or focal lesion. Sinuses/Orbits: No acute finding. Other: None. CT CERVICAL SPINE FINDINGS Alignment: Normal. Skull base and vertebrae: No acute fracture. No primary bone lesion or focal pathologic process. Soft tissues and spinal canal: No prevertebral fluid or swelling. No visible canal hematoma. Bilateral carotid arterial calcifications. Disc levels: C2-3 mild facet DJD right greater than left C3-4 mild narrowing of the interspace. Facet and uncovertebral DJD resulting in foraminal stenosis right worse than left. C4-5 Fusion across the facet joints bilaterally. C5-6 moderate narrowing of the interspace with posterior protrusion. C6-7 moderate narrowing of the interspace Upper chest: Negative. Other: Streak artifact from dental restorations. IMPRESSION: 1. Negative for bleed or other acute intracranial process. 2. 2 cm mass in the right middle cranial fossa,  new since 2007. Differential diagnosis includes benign meningioma, less likely intra-axial neoplasm or atypical aneurysm. Recommend elective MR head with contrast for further characterization. 3. Negative for cervical fracture or dislocation. 4. Multilevel cervical spondylitic changes as enumerated above. Electronically Signed   By: Corlis Leak M.D.   On: 06/21/2016 09:41   Ct Cervical Spine Wo Contrast  Result Date: 06/21/2016 CLINICAL DATA:  Kelly Caldwell came to ED via EMS from home. Kelly Caldwell has unwitnessed fall, Kelly Caldwell does not remember falling. Thinks she slipped out the end of her bed. Reports right hip pain. Not on blood thinners. EXAM: CT HEAD WITHOUT CONTRAST CT CERVICAL SPINE WITHOUT CONTRAST TECHNIQUE: Multidetector CT imaging of the head and cervical spine was performed following the standard protocol without intravenous contrast. Multiplanar CT image reconstructions of the cervical spine were also generated. COMPARISON:  04/24/2005 FINDINGS: CT HEAD FINDINGS Brain: 2 cm hyperdense lesion in the right middle cranial fossa abutting the tentorium and involving the temporal lobe. Lesion not evident on prior study from 2007. No definite dural tail. No acute intracranial hemorrhage, midline shift, or focal parenchymal edema. Acute infarct may be inapparent on noncontrast CT. Mild diffuse parenchymal atrophy. Patchy areas of hypoattenuation in deep and periventricular white matter bilaterally. Ventricles and sulci normal in size and symmetry. Vascular: Atherosclerotic and physiologic intracranial calcifications. Skull: Normal. Negative for fracture or focal lesion. Sinuses/Orbits: No acute finding. Other: None. CT CERVICAL SPINE FINDINGS Alignment: Normal. Skull base and vertebrae: No acute fracture. No primary bone lesion or focal pathologic process. Soft tissues and spinal canal: No prevertebral fluid or swelling. No visible canal hematoma. Bilateral carotid arterial calcifications. Disc levels: C2-3 mild facet DJD right  greater than left C3-4 mild narrowing of the interspace. Facet and uncovertebral DJD resulting in foraminal stenosis right worse than left. C4-5 Fusion across the facet joints bilaterally. C5-6 moderate narrowing of the interspace with posterior protrusion. C6-7 moderate narrowing of the interspace Upper chest: Negative. Other: Streak artifact from dental restorations. IMPRESSION: 1. Negative for bleed or other acute intracranial process. 2. 2 cm mass in the right middle cranial fossa, new since 2007. Differential diagnosis includes benign meningioma, less likely intra-axial neoplasm or atypical aneurysm. Recommend elective MR head with contrast for further characterization. 3. Negative for cervical fracture or dislocation. 4. Multilevel cervical  spondylitic changes as enumerated above. Electronically Signed   By: Corlis Leak M.D.   On: 06/21/2016 09:41   Dg Chest Portable 1 View  Result Date: 06/21/2016 CLINICAL DATA:  Syncope, fell EXAM: PORTABLE CHEST - 1 VIEW COMPARISON:  04/24/2005 FINDINGS: Large hiatal hernia, limiting evaluation of the lung bases. Mild pulmonary vascular congestion. Stable cardiomegaly.  Atheromatous aorta. Small right pleural effusion.  No pneumothorax. Mild thoracic dextroscoliosis. IMPRESSION: 1. Large hiatal hernia. 2. Small right pleural effusion. 3. No pneumothorax. 4. Central pulmonary vascular congestion, new since previous exam Electronically Signed   By: Corlis Leak M.D.   On: 06/21/2016 10:32   Dg Hip Operative Unilat W Or W/o Pelvis Right  Result Date: 06/21/2016 CLINICAL DATA:  Intraoperative repair hip fracture. EXAM: OPERATIVE RIGHT HIP (WITH PELVIS IF PERFORMED) 2 VIEWS TECHNIQUE: Fluoroscopic spot image(s) were submitted for interpretation post-operatively. COMPARISON:  None. FINDINGS: Three screws now cross the right hip fracture. IMPRESSION: Right hip fracture repair as above. Electronically Signed   By: Gerome Sam III M.D   On: 06/21/2016 17:22   Dg Hip  Unilat W Or Wo Pelvis 2-3 Views Right  Result Date: 06/21/2016 CLINICAL DATA:  Kelly Caldwell had unwitnessed fall last night. Right hip pain since. No prior injury. EXAM: DG HIP (WITH OR WITHOUT PELVIS) 2-3V RIGHT COMPARISON:  None. FINDINGS: Impacted subcapital fracture, right femur. No dislocation. Diffuse osteopenia. Bony pelvis intact. Question of old fracture deformity, left iliac wing. Left pelvic phleboliths. IMPRESSION: Impacted right femoral subcapital fracture. Electronically Signed   By: Corlis Leak M.D.   On: 06/21/2016 09:18    Assessment/Plan 1. Pneumonia of both lower lobes due to infectious organism (HCC)  Rocephin 2 grams IM Q Day x 5 days, Reconstitute with Lidocaine  2. Closed fracture of right hip with routine healing, subsequent encounter  Continue Tylenol 650 mg po Q 4 hours prn pain  Morphine concentrate 20 mg/ mL 0.25-0.5 ml po Q 1 hour prn pain, dyspnea   3. Chronic midline low back pain without sciatica  Aspercreme Lidocaine 4% patch- apply 1 patch to lower back Q Day, remove after 12 hours  4. Somnolence  Do not give Morphine and alprazolam at the same time  5. Encounter for Dying Care  Family Meeting for Advanced Care Planning  DNR  Continue Oxygen 2-5 liters/ minute- titrate for comfort  Morphine concentrate 20 mg/ mL 0.25-0.5 ml po Q 1 hour prn pain, dyspnea  Alprazolam 0.25 mg po Q 6 hours prn anxiety, agitation, restlessness  Atropine 1% drops- 2 drops under the tongue Q 30 minutes prn secretions  Continue Foley for comfort  Hospice referral for EOL care in facility- ON HOLD FOR NOW  Palliative Care Referral for ongoing discussions of goals of care  Family/ staff Communication:   Total Time: 60 minutes  Documentation:  Face to Face: 15 minutes  Family/Phone: 45 minutes- Advanced Care Plan Meeting again   Labs/tests ordered:    Medication list reviewed and assessed for continued appropriateness.  Brynda Rim,  NP-C Geriatrics [facility] [id number] [place], [place] [address] Cell Phone (Mon-Fri 8am-5pm):  [phone] On Call:  [phone] & follow prompts after 5pm & weekends Office Phone:  [phone] Office Fax:  [phone]

## 2016-07-02 DIAGNOSIS — R05 Cough: Secondary | ICD-10-CM | POA: Diagnosis not present

## 2016-07-02 LAB — GLUCOSE, CAPILLARY
GLUCOSE-CAPILLARY: 230 mg/dL — AB (ref 65–99)
GLUCOSE-CAPILLARY: 205 mg/dL — AB (ref 65–99)
Glucose-Capillary: 119 mg/dL — ABNORMAL HIGH (ref 65–99)

## 2016-07-03 ENCOUNTER — Encounter
Admission: RE | Admit: 2016-07-03 | Discharge: 2016-07-03 | Disposition: A | Discharge status: Home / Self Care | Payer: Medicare Other | Source: Ambulatory Visit | Attending: Internal Medicine | Admitting: Internal Medicine

## 2016-07-03 LAB — GLUCOSE, CAPILLARY
Glucose-Capillary: 123 mg/dL — ABNORMAL HIGH (ref 65–99)
Glucose-Capillary: 161 mg/dL — ABNORMAL HIGH (ref 65–99)

## 2016-07-07 ENCOUNTER — Non-Acute Institutional Stay (SKILLED_NURSING_FACILITY): Payer: Medicare Other | Admitting: Gerontology

## 2016-07-07 DIAGNOSIS — H6123 Impacted cerumen, bilateral: Secondary | ICD-10-CM | POA: Diagnosis not present

## 2016-07-07 DIAGNOSIS — B372 Candidiasis of skin and nail: Secondary | ICD-10-CM

## 2016-07-09 ENCOUNTER — Other Ambulatory Visit
Admission: RE | Admit: 2016-07-09 | Discharge: 2016-07-09 | Disposition: A | Discharge status: Home / Self Care | Payer: Medicare Other | Source: Ambulatory Visit | Attending: Gerontology | Admitting: Gerontology

## 2016-07-09 DIAGNOSIS — X58XXXA Exposure to other specified factors, initial encounter: Secondary | ICD-10-CM | POA: Insufficient documentation

## 2016-07-09 DIAGNOSIS — S72001D Fracture of unspecified part of neck of right femur, subsequent encounter for closed fracture with routine healing: Secondary | ICD-10-CM | POA: Diagnosis present

## 2016-07-09 LAB — CBC WITH DIFFERENTIAL/PLATELET
Basophils Absolute: 0.1 10*3/uL (ref 0–0.1)
Basophils Relative: 0 %
EOS ABS: 0.2 10*3/uL (ref 0–0.7)
EOS PCT: 1 %
HCT: 35.1 % (ref 35.0–47.0)
Hemoglobin: 11.7 g/dL — ABNORMAL LOW (ref 12.0–16.0)
LYMPHS ABS: 1.6 10*3/uL (ref 1.0–3.6)
LYMPHS PCT: 11 %
MCH: 33.3 pg (ref 26.0–34.0)
MCHC: 33.2 g/dL (ref 32.0–36.0)
MCV: 100.4 fL — AB (ref 80.0–100.0)
MONO ABS: 0.8 10*3/uL (ref 0.2–0.9)
Monocytes Relative: 6 %
Neutro Abs: 11.6 10*3/uL — ABNORMAL HIGH (ref 1.4–6.5)
Neutrophils Relative %: 82 %
PLATELETS: 394 10*3/uL (ref 150–440)
RBC: 3.5 MIL/uL — ABNORMAL LOW (ref 3.80–5.20)
RDW: 14.5 % (ref 11.5–14.5)
WBC: 14.2 10*3/uL — ABNORMAL HIGH (ref 3.6–11.0)

## 2016-07-09 LAB — COMPREHENSIVE METABOLIC PANEL
ALT: 14 U/L (ref 14–54)
ANION GAP: 8 (ref 5–15)
AST: 26 U/L (ref 15–41)
Albumin: 2.7 g/dL — ABNORMAL LOW (ref 3.5–5.0)
Alkaline Phosphatase: 144 U/L — ABNORMAL HIGH (ref 38–126)
BUN: 14 mg/dL (ref 6–20)
CHLORIDE: 97 mmol/L — AB (ref 101–111)
CO2: 32 mmol/L (ref 22–32)
CREATININE: 0.52 mg/dL (ref 0.44–1.00)
Calcium: 9 mg/dL (ref 8.9–10.3)
Glucose, Bld: 194 mg/dL — ABNORMAL HIGH (ref 65–99)
POTASSIUM: 3.5 mmol/L (ref 3.5–5.1)
Sodium: 137 mmol/L (ref 135–145)
Total Bilirubin: 0.5 mg/dL (ref 0.3–1.2)
Total Protein: 6.2 g/dL — ABNORMAL LOW (ref 6.5–8.1)

## 2016-07-14 ENCOUNTER — Non-Acute Institutional Stay (SKILLED_NURSING_FACILITY): Payer: Medicare Other | Admitting: Gerontology

## 2016-07-14 DIAGNOSIS — M545 Low back pain: Secondary | ICD-10-CM | POA: Diagnosis not present

## 2016-07-14 DIAGNOSIS — J181 Lobar pneumonia, unspecified organism: Secondary | ICD-10-CM

## 2016-07-14 DIAGNOSIS — G8929 Other chronic pain: Secondary | ICD-10-CM

## 2016-07-14 DIAGNOSIS — R400 Somnolence: Secondary | ICD-10-CM | POA: Diagnosis not present

## 2016-07-14 DIAGNOSIS — S72001D Fracture of unspecified part of neck of right femur, subsequent encounter for closed fracture with routine healing: Secondary | ICD-10-CM | POA: Diagnosis not present

## 2016-07-14 DIAGNOSIS — Z515 Encounter for palliative care: Secondary | ICD-10-CM | POA: Diagnosis not present

## 2016-07-14 DIAGNOSIS — J189 Pneumonia, unspecified organism: Secondary | ICD-10-CM

## 2016-07-14 NOTE — Progress Notes (Signed)
Location:      Place of Service:  SNF (31) Provider:  Lorenso Quarry, NP-C  Corky Downs, MD  Patient Care Team: Corky Downs, MD as PCP - General (Internal Medicine)  Extended Emergency Contact Information Primary Emergency Contact: Nicole Cella Address: [address]          [place], [place] [address] [place] of [place] Home Phone: [phone] Mobile Phone: [phone] Relation: Daughter Secondary Emergency Contact: Gerda Diss [place] of [place] Mobile Phone: [phone] Relation: None  Code Status:  DNR Goals of care: Advanced Directive information Advanced Directives 06/21/2016  Does Patient Have a Medical Advance Directive? No  Would patient like information on creating a medical advance directive? No - Patient declined     Chief Complaint  Patient presents with  . Follow-up    HPI:  Pt is a 83 y.o. female seen today for a follow up visit for Pneumonia, somnolence, back pain and hip fracture. Pt was having cough, congestion, low O2 sats. Pt has made a very impressive recovery. She is now walking with therapy down the halls. She is stand-by assist with the walker. No cough. Lungs continue to be diminished in the bases. Voiding well. LBM 6/11. Pt has not required any meds for pain aside from the scheduled Tylenol. Palliative to still follow pt. No continued complaints of difficulty hearing. Pt denies n/v/d/f/c/cp/sob/ha/abd pain/dizziness/cough. Will continue to follow closely. VSS. No other complaints.    Past Medical History:  Diagnosis Date  . Arthritis   . HLD (hyperlipidemia)   . HTN (hypertension)   . Hypothyroidism   . Urinary frequency    Past Surgical History:  Procedure Laterality Date  . HIP PINNING,CANNULATED Right 06/21/2016   Procedure: CANNULATED HIP PINNING;  Surgeon: Danelle Earthly, MD;  Location: [facility] ORS;  Service: Orthopedics;  Laterality: Right;  . INCONTINENCE SURGERY      No Known Allergies  Allergies as of 07/14/2016    No Known Allergies     Medication List       Accurate as of 07/14/16  2:10 PM. Always use your most recent med list.          acetaminophen 650 MG suppository Commonly known as:  TYLENOL Place 1 suppository (650 mg total) rectally every 6 (six) hours as needed for mild pain (or Fever >/= 101).   ALPRAZolam 0.5 MG tablet Commonly known as:  XANAX Take 1 tablet (0.5 mg total) by mouth at bedtime.   amLODipine 2.5 MG tablet Commonly known as:  NORVASC Take 1 tablet by mouth daily.   aspirin EC 81 MG tablet Take 81 mg by mouth at bedtime.   atorvastatin 40 MG tablet Commonly known as:  LIPITOR Take 40 mg by mouth at bedtime.   cephALEXin 500 MG capsule Commonly known as:  KEFLEX Take 1 capsule (500 mg total) by mouth 2 (two) times daily.   cholecalciferol 1000 units tablet Commonly known as:  VITAMIN D Take 4,000 Units by mouth daily.   Co Q-10 200 MG Caps Take 1 capsule by mouth daily.   docusate sodium 100 MG capsule Commonly known as:  COLACE Take 1 capsule (100 mg total) by mouth daily.   enoxaparin 30 MG/0.3ML injection Commonly known as:  LOVENOX Inject 0.3 mLs (30 mg total) into the skin daily.   levothyroxine 100 MCG tablet Commonly known as:  SYNTHROID, LEVOTHROID Take 100 mcg by mouth daily before breakfast.   lisinopril 2.5 MG tablet Commonly known as:  PRINIVIL,ZESTRIL Take 2.5  mg by mouth daily.   metFORMIN 1000 MG tablet Commonly known as:  GLUCOPHAGE Take 1,000 mg by mouth 2 (two) times daily with a meal.   multivitamin tablet Take 1 tablet by mouth daily.   traMADol 50 MG tablet Commonly known as:  ULTRAM Take 1 tablet (50 mg total) by mouth every 6 (six) hours as needed for moderate pain.   vitamin C 1000 MG tablet Take 1,000 mg by mouth daily.       Review of Systems  Constitutional: Positive for fatigue (with exertion). Negative for activity change, appetite change, chills, diaphoresis and fever.  HENT: Negative for  congestion, sneezing, sore throat, trouble swallowing and voice change.   Respiratory: Negative for apnea, cough, choking, chest tightness, shortness of breath and wheezing.   Cardiovascular: Negative for chest pain, palpitations and leg swelling.  Gastrointestinal: Negative for abdominal distention, abdominal pain, constipation, diarrhea and nausea.  Genitourinary: Negative for difficulty urinating, dysuria, frequency and urgency.  Musculoskeletal: Positive for arthralgias (typical arthritis) and joint swelling. Negative for back pain, gait problem and myalgias.  Skin: Positive for wound. Negative for color change, pallor and rash.  Neurological: Positive for weakness. Negative for dizziness, tremors, syncope, speech difficulty, numbness and headaches.  Psychiatric/Behavioral: Negative for agitation and behavioral problems.  All other systems reviewed and are negative.    There is no immunization history on file for this patient. Pertinent  Health Maintenance Due  Topic Date Due  . DEXA SCAN  11-15-201984  . PNA vac Low Risk Adult (1 of 2 - PCV13) 11-15-201984  . INFLUENZA VACCINE  09/02/2016   No flowsheet data found. Functional Status Survey:    Vitals:   07/14/16 0545  BP: (!) 130/52  Pulse: 70  Resp: 19  Temp: 98.3 F (36.8 C)  SpO2: 95%   There is no height or weight on file to calculate BMI. Physical Exam  Constitutional: She is oriented to person, place, and time. Vital signs are normal. She appears well-developed and well-nourished. She is active and cooperative. She appears ill. No distress. Nasal cannula in place.  HENT:  Head: Normocephalic and atraumatic.  Mouth/Throat: Uvula is midline, oropharynx is clear and moist and mucous membranes are normal. Mucous membranes are not pale, not dry and not cyanotic.  Eyes: Conjunctivae, EOM and lids are normal. Pupils are equal, round, and reactive to light.  Neck: Trachea normal, normal range of motion and full passive range of  motion without pain. Neck supple. No JVD present. No tracheal deviation, no edema and no erythema present. No thyromegaly present.  Cardiovascular: Normal rate, intact distal pulses and normal pulses.  An irregular rhythm present. Exam reveals no gallop, no distant heart sounds and no friction rub.   Murmur heard. Pulses:      Dorsalis pedis pulses are 2+ on the right side, and 2+ on the left side.  Pulmonary/Chest: Effort normal. No accessory muscle usage. No respiratory distress. She has decreased breath sounds (shallow respirations) in the right lower field and the left lower field. She has no wheezes. She has no rhonchi. She has no rales. She exhibits no tenderness.  Abdominal: Soft. Normal appearance. She exhibits no distension and no ascites. Bowel sounds are decreased. There is no tenderness.  Musculoskeletal: She exhibits no edema.       Right hip: She exhibits decreased range of motion, decreased strength, tenderness and laceration.  Expected osteoarthritis, stiffness; calves soft, supple. Negative Homan's sign  Neurological: She is alert and oriented to person,  place, and time. She has normal strength.  Skin: Skin is warm and dry. Laceration (right hip incision) noted. No rash noted. She is not diaphoretic. No cyanosis or erythema. No pallor. Nails show no clubbing.  Psychiatric: She has a normal mood and affect. Her speech is normal and behavior is normal. Judgment and thought content normal. Cognition and memory are normal.  Nursing note and vitals reviewed.   Labs reviewed:  Recent Labs  06/23/16 0422 06/25/16 1338 07/09/16 1410  NA 139 134* 137  K 3.9 4.7 3.5  CL 103 96* 97*  CO2 26 32 32  GLUCOSE 151* 125* 194*  BUN 13 17 14   CREATININE 0.62 0.50 0.52  CALCIUM 9.3 9.5 9.0    Recent Labs  06/22/16 0425 06/25/16 1338 07/09/16 1410  AST 23 19 26   ALT 15 11* 14  ALKPHOS 109 97 144*  BILITOT 0.9 0.6 0.5  PROT 6.8 6.7 6.2*  ALBUMIN 3.3* 2.9* 2.7*    Recent  Labs  06/21/16 0835 06/22/16 0425 06/25/16 1338 07/09/16 1410  WBC 15.3* 15.6* 11.0 14.2*  NEUTROABS 12.4*  --  8.2* 11.6*  HGB 12.6 12.0 12.1 11.7*  HCT 37.5 36.4 36.5 35.1  MCV 99.0 99.8 100.3* 100.4*  PLT 235 216 244 394   Lab Results  Component Value Date   TSH 0.985 06/22/2016   Lab Results  Component Value Date   HGBA1C 8.1 (H) 06/22/2016   Lab Results  Component Value Date   CHOL 130 06/22/2016   HDL 71 06/22/2016   LDLCALC 47 06/22/2016   TRIG 59 06/22/2016   CHOLHDL 1.8 06/22/2016    Significant Diagnostic Results in last 30 days:  Ct Head Wo Contrast  Result Date: 06/21/2016 CLINICAL DATA:  Pt came to ED via EMS from home. Pt has unwitnessed fall, pt does not remember falling. Thinks she slipped out the end of her bed. Reports right hip pain. Not on blood thinners. EXAM: CT HEAD WITHOUT CONTRAST CT CERVICAL SPINE WITHOUT CONTRAST TECHNIQUE: Multidetector CT imaging of the head and cervical spine was performed following the standard protocol without intravenous contrast. Multiplanar CT image reconstructions of the cervical spine were also generated. COMPARISON:  04/24/2005 FINDINGS: CT HEAD FINDINGS Brain: 2 cm hyperdense lesion in the right middle cranial fossa abutting the tentorium and involving the temporal lobe. Lesion not evident on prior study from 2007. No definite dural tail. No acute intracranial hemorrhage, midline shift, or focal parenchymal edema. Acute infarct may be inapparent on noncontrast CT. Mild diffuse parenchymal atrophy. Patchy areas of hypoattenuation in deep and periventricular white matter bilaterally. Ventricles and sulci normal in size and symmetry. Vascular: Atherosclerotic and physiologic intracranial calcifications. Skull: Normal. Negative for fracture or focal lesion. Sinuses/Orbits: No acute finding. Other: None. CT CERVICAL SPINE FINDINGS Alignment: Normal. Skull base and vertebrae: No acute fracture. No primary bone lesion or focal  pathologic process. Soft tissues and spinal canal: No prevertebral fluid or swelling. No visible canal hematoma. Bilateral carotid arterial calcifications. Disc levels: C2-3 mild facet DJD right greater than left C3-4 mild narrowing of the interspace. Facet and uncovertebral DJD resulting in foraminal stenosis right worse than left. C4-5 Fusion across the facet joints bilaterally. C5-6 moderate narrowing of the interspace with posterior protrusion. C6-7 moderate narrowing of the interspace Upper chest: Negative. Other: Streak artifact from dental restorations. IMPRESSION: 1. Negative for bleed or other acute intracranial process. 2. 2 cm mass in the right middle cranial fossa, new since 2007. Differential diagnosis includes benign meningioma,  less likely intra-axial neoplasm or atypical aneurysm. Recommend elective MR head with contrast for further characterization. 3. Negative for cervical fracture or dislocation. 4. Multilevel cervical spondylitic changes as enumerated above. Electronically Signed   By: Corlis Leak M.D.   On: 06/21/2016 09:41   Ct Cervical Spine Wo Contrast  Result Date: 06/21/2016 CLINICAL DATA:  Pt came to ED via EMS from home. Pt has unwitnessed fall, pt does not remember falling. Thinks she slipped out the end of her bed. Reports right hip pain. Not on blood thinners. EXAM: CT HEAD WITHOUT CONTRAST CT CERVICAL SPINE WITHOUT CONTRAST TECHNIQUE: Multidetector CT imaging of the head and cervical spine was performed following the standard protocol without intravenous contrast. Multiplanar CT image reconstructions of the cervical spine were also generated. COMPARISON:  04/24/2005 FINDINGS: CT HEAD FINDINGS Brain: 2 cm hyperdense lesion in the right middle cranial fossa abutting the tentorium and involving the temporal lobe. Lesion not evident on prior study from 2007. No definite dural tail. No acute intracranial hemorrhage, midline shift, or focal parenchymal edema. Acute infarct may be  inapparent on noncontrast CT. Mild diffuse parenchymal atrophy. Patchy areas of hypoattenuation in deep and periventricular white matter bilaterally. Ventricles and sulci normal in size and symmetry. Vascular: Atherosclerotic and physiologic intracranial calcifications. Skull: Normal. Negative for fracture or focal lesion. Sinuses/Orbits: No acute finding. Other: None. CT CERVICAL SPINE FINDINGS Alignment: Normal. Skull base and vertebrae: No acute fracture. No primary bone lesion or focal pathologic process. Soft tissues and spinal canal: No prevertebral fluid or swelling. No visible canal hematoma. Bilateral carotid arterial calcifications. Disc levels: C2-3 mild facet DJD right greater than left C3-4 mild narrowing of the interspace. Facet and uncovertebral DJD resulting in foraminal stenosis right worse than left. C4-5 Fusion across the facet joints bilaterally. C5-6 moderate narrowing of the interspace with posterior protrusion. C6-7 moderate narrowing of the interspace Upper chest: Negative. Other: Streak artifact from dental restorations. IMPRESSION: 1. Negative for bleed or other acute intracranial process. 2. 2 cm mass in the right middle cranial fossa, new since 2007. Differential diagnosis includes benign meningioma, less likely intra-axial neoplasm or atypical aneurysm. Recommend elective MR head with contrast for further characterization. 3. Negative for cervical fracture or dislocation. 4. Multilevel cervical spondylitic changes as enumerated above. Electronically Signed   By: Corlis Leak M.D.   On: 06/21/2016 09:41   Dg Chest Portable 1 View  Result Date: 06/21/2016 CLINICAL DATA:  Syncope, fell EXAM: PORTABLE CHEST - 1 VIEW COMPARISON:  04/24/2005 FINDINGS: Large hiatal hernia, limiting evaluation of the lung bases. Mild pulmonary vascular congestion. Stable cardiomegaly.  Atheromatous aorta. Small right pleural effusion.  No pneumothorax. Mild thoracic dextroscoliosis. IMPRESSION: 1. Large hiatal  hernia. 2. Small right pleural effusion. 3. No pneumothorax. 4. Central pulmonary vascular congestion, new since previous exam Electronically Signed   By: Corlis Leak M.D.   On: 06/21/2016 10:32   Dg Hip Operative Unilat W Or W/o Pelvis Right  Result Date: 06/21/2016 CLINICAL DATA:  Intraoperative repair hip fracture. EXAM: OPERATIVE RIGHT HIP (WITH PELVIS IF PERFORMED) 2 VIEWS TECHNIQUE: Fluoroscopic spot image(s) were submitted for interpretation post-operatively. COMPARISON:  None. FINDINGS: Three screws now cross the right hip fracture. IMPRESSION: Right hip fracture repair as above. Electronically Signed   By: Gerome Sam III M.D   On: 06/21/2016 17:22   Dg Hip Unilat W Or Wo Pelvis 2-3 Views Right  Result Date: 06/21/2016 CLINICAL DATA:  Pt had unwitnessed fall last night. Right hip pain since. No  prior injury. EXAM: DG HIP (WITH OR WITHOUT PELVIS) 2-3V RIGHT COMPARISON:  None. FINDINGS: Impacted subcapital fracture, right femur. No dislocation. Diffuse osteopenia. Bony pelvis intact. Question of old fracture deformity, left iliac wing. Left pelvic phleboliths. IMPRESSION: Impacted right femoral subcapital fracture. Electronically Signed   By: Corlis Leak M.D.   On: 06/21/2016 09:18    Assessment/Plan 1. Pneumonia of both lower lobes due to infectious organism [facility])  Resolved  2. Closed fracture of right hip with routine healing, subsequent encounter  Continue Tylenol 650 mg po QID scheduled for pain  3. Chronic midline low back pain without sciatica  Aspercreme Lidocaine 4% patch- apply 1 patch to lower back Q Day, remove after 12 hours  4. Somnolence  Resolved  5. Encounter for Dying Care  Resolved- pt has made an impressive resovery  DNR  Wean Oxygen 2-5 liters/ minute- titrate for comfort  Discontinue Morphine concentrate 20 mg/ mL 0.25-0.5 ml po Q 1 hour prn pain, dyspnea  Discontinue Alprazolam 0.25 mg po Q 6 hours prn anxiety, agitation,  restlessness  Discontinue Atropine 1% drops- 2 drops under the tongue Q 30 minutes prn secretions  Discontinue Foley for comfort  Palliative Care Referral for ongoing discussions of goals of care  Family/ staff Communication:   Total Time: 60 minutes  Documentation:  Face to Face: 15 minutes  Family/Phone: 45 minutes- Advanced Care Plan Meeting again   Labs/tests ordered:    Medication list reviewed and assessed for continued appropriateness.  Brynda Rim, NP-C Geriatrics [facility] [phone] [address], [place] [address] Cell Phone (Mon-Fri 8am-5pm):  [phone] On Call:  [phone] & follow prompts after 5pm & weekends Office Phone:  [phone] Office Fax:  [phone]

## 2016-07-21 ENCOUNTER — Non-Acute Institutional Stay (SKILLED_NURSING_FACILITY): Payer: Medicare Other | Admitting: Gerontology

## 2016-07-21 DIAGNOSIS — R21 Rash and other nonspecific skin eruption: Secondary | ICD-10-CM | POA: Diagnosis not present

## 2016-07-21 NOTE — Progress Notes (Signed)
Location:      Place of Service:  SNF (31) Provider:  Lorenso QuarryShannon Tresa Jolley, NP-C  Corky DownsMasoud, Javed, MD  Patient Care Team: Corky DownsMasoud, Javed, MD as PCP - General (Internal Medicine)  Extended Emergency Contact Information Primary Emergency Contact: Nicole CellaGant,[name] L Address: [address]          [place], [place] [address] [place] Home Phone: [phone] Mobile Phone: [phone] Relation: Daughter Secondary Emergency Contact: Gerda DissGant,Eric  [place] Mobile Phone: [phone] Relation: None  Code Status:  DNR Goals of care: Advanced Directive information Advanced Directives 06/21/2016  Does Patient Have a Medical Advance Directive? No  Would patient like information on creating a medical advance directive? No - Patient declined     Chief Complaint  Patient presents with  . Acute Visit  . Follow-up    HPI:  Kelly Caldwell is a 83 y.o. female seen today for a follow up visit for Pneumonia, somnolence, back pain and hip fracture and acute visit for rash on the lower back. Pneumonia is now resolved. Pain from the hip fracture is well controlled. She is now walking with therapy down the halls. She is stand-by assist with the walker. No cough. Lungs clear. Voiding well. Kelly Caldwell has not required any meds for pain aside from the scheduled Tylenol. Raised, erythematis, itchy rash in the area of the Lidocaine patch. Kelly Caldwell reports itching is improved since the patch was removed today. No pustules. No drainage. Dry, scabbed areas. Palliative to still follow Kelly Caldwell. No continued complaints of difficulty hearing. Kelly Caldwell denies n/v/d/f/c/cp/sob/ha/abd pain/dizziness/cough. Will continue to follow closely. VSS. No other complaints.    Past Medical History:  Diagnosis Date  . Arthritis   . HLD (hyperlipidemia)   . HTN (hypertension)   . Hypothyroidism   . Urinary frequency    Past Surgical History:  Procedure Laterality Date  . HIP PINNING,CANNULATED Right 06/21/2016   Procedure: CANNULATED HIP  PINNING;  Surgeon: Danelle EarthlyEckel, Tobin T, MD;  Location: [facility] ORS;  Service: Orthopedics;  Laterality: Right;  . INCONTINENCE SURGERY      No Known Allergies  Allergies as of 07/21/2016   No Known Allergies     Medication List       Accurate as of 07/21/16  2:50 PM. Always use your most recent med list.          acetaminophen 650 MG suppository Commonly known as:  TYLENOL Place 1 suppository (650 mg total) rectally every 6 (six) hours as needed for mild pain (or Fever >/= 101).   ALPRAZolam 0.5 MG tablet Commonly known as:  XANAX Take 1 tablet (0.5 mg total) by mouth at bedtime.   amLODipine 2.5 MG tablet Commonly known as:  NORVASC Take 1 tablet by mouth daily.   aspirin EC 81 MG tablet Take 81 mg by mouth at bedtime.   atorvastatin 40 MG tablet Commonly known as:  LIPITOR Take 40 mg by mouth at bedtime.   cephALEXin 500 MG capsule Commonly known as:  KEFLEX Take 1 capsule (500 mg total) by mouth 2 (two) times daily.   cholecalciferol 1000 units tablet Commonly known as:  VITAMIN D Take 4,000 Units by mouth daily.   Co Q-10 200 MG Caps Take 1 capsule by mouth daily.   docusate sodium 100 MG capsule Commonly known as:  COLACE Take 1 capsule (100 mg total) by mouth daily.   enoxaparin 30 MG/0.3ML injection Commonly known as:  LOVENOX Inject 0.3 mLs (30 mg total) into the skin daily.  levothyroxine 100 MCG tablet Commonly known as:  SYNTHROID, LEVOTHROID Take 100 mcg by mouth daily before breakfast.   lisinopril 2.5 MG tablet Commonly known as:  PRINIVIL,ZESTRIL Take 2.5 mg by mouth daily.   metFORMIN 1000 MG tablet Commonly known as:  GLUCOPHAGE Take 1,000 mg by mouth 2 (two) times daily with a meal.   multivitamin tablet Take 1 tablet by mouth daily.   traMADol 50 MG tablet Commonly known as:  ULTRAM Take 1 tablet (50 mg total) by mouth every 6 (six) hours as needed for moderate pain.   vitamin C 1000 MG tablet Take 1,000 mg by mouth daily.        Review of Systems  Constitutional: Negative for activity change, appetite change, chills, diaphoresis, fatigue (with exertion) and fever.  HENT: Negative for congestion, sneezing, sore throat, trouble swallowing and voice change.   Respiratory: Negative for apnea, cough, choking, chest tightness, shortness of breath and wheezing.   Cardiovascular: Negative for chest pain, palpitations and leg swelling.  Gastrointestinal: Negative for abdominal distention, abdominal pain, constipation, diarrhea and nausea.  Genitourinary: Negative for difficulty urinating, dysuria, frequency and urgency.  Musculoskeletal: Positive for arthralgias (typical arthritis). Negative for back pain, gait problem, joint swelling and myalgias.  Skin: Positive for rash. Negative for color change, pallor and wound.  Neurological: Negative for dizziness, tremors, syncope, speech difficulty, weakness, numbness and headaches.  Psychiatric/Behavioral: Negative for agitation and behavioral problems.  All other systems reviewed and are negative.    There is no immunization history on file for this patient. Pertinent  Health Maintenance Due  Topic Date Due  . DEXA SCAN  Jul 22, 201984  . PNA vac Low Risk Adult (1 of 2 - PCV13) Jul 22, 201984  . INFLUENZA VACCINE  09/02/2016   No flowsheet data found. Functional Status Survey:    Vitals:   07/20/16 2345  BP: (!) 158/54  Pulse: 79  Resp: 18  Temp: 97.7 F (36.5 C)  SpO2: 93%   There is no height or weight on file to calculate BMI. Physical Exam  Constitutional: She is oriented to person, place, and time. Vital signs are normal. She appears well-developed and well-nourished. She is active and cooperative. She appears ill. No distress. Nasal cannula in place.  HENT:  Head: Normocephalic and atraumatic.  Mouth/Throat: Uvula is midline, oropharynx is clear and moist and mucous membranes are normal. Mucous membranes are not pale, not dry and not cyanotic.  Eyes:  Conjunctivae, EOM and lids are normal. Pupils are equal, round, and reactive to light.  Neck: Trachea normal, normal range of motion and full passive range of motion without pain. Neck supple. No JVD present. No tracheal deviation, no edema and no erythema present. No thyromegaly present.  Cardiovascular: Normal rate, intact distal pulses and normal pulses.  An irregular rhythm present. Exam reveals no gallop, no distant heart sounds and no friction rub.   Murmur heard. Pulses:      Dorsalis pedis pulses are 2+ on the right side, and 2+ on the left side.  Pulmonary/Chest: Effort normal. No accessory muscle usage. No respiratory distress. She has decreased breath sounds (shallow respirations) in the right lower field and the left lower field. She has no wheezes. She has no rhonchi. She has no rales. She exhibits no tenderness.  Abdominal: Soft. Normal appearance. She exhibits no distension and no ascites. Bowel sounds are decreased. There is no tenderness.  Musculoskeletal: She exhibits no edema.       Right hip: She exhibits decreased strength  and laceration. She exhibits normal range of motion and no tenderness.  Expected osteoarthritis, stiffness; calves soft, supple. Negative Homan's sign  Neurological: She is alert and oriented to person, place, and time. She has normal strength.  Skin: Skin is warm and dry. Rash noted. No laceration (right hip incision- healed) noted. Rash is urticarial. She is not diaphoretic. No cyanosis or erythema. No pallor. Nails show no clubbing.     Psychiatric: She has a normal mood and affect. Her speech is normal and behavior is normal. Judgment and thought content normal. Cognition and memory are normal.  Nursing note and vitals reviewed.   Labs reviewed:  Recent Labs  06/23/16 0422 06/25/16 1338 07/09/16 1410  NA 139 134* 137  K 3.9 4.7 3.5  CL 103 96* 97*  CO2 26 32 32  GLUCOSE 151* 125* 194*  BUN 13 17 14   CREATININE 0.62 0.50 0.52  CALCIUM 9.3  9.5 9.0    Recent Labs  06/22/16 0425 06/25/16 1338 07/09/16 1410  AST 23 19 26   ALT 15 11* 14  ALKPHOS 109 97 144*  BILITOT 0.9 0.6 0.5  PROT 6.8 6.7 6.2*  ALBUMIN 3.3* 2.9* 2.7*    Recent Labs  06/21/16 0835 06/22/16 0425 06/25/16 1338 07/09/16 1410  WBC 15.3* 15.6* 11.0 14.2*  NEUTROABS 12.4*  --  8.2* 11.6*  HGB 12.6 12.0 12.1 11.7*  HCT 37.5 36.4 36.5 35.1  MCV 99.0 99.8 100.3* 100.4*  PLT 235 216 244 394   Lab Results  Component Value Date   TSH 0.985 06/22/2016   Lab Results  Component Value Date   HGBA1C 8.1 (H) 06/22/2016   Lab Results  Component Value Date   CHOL 130 06/22/2016   HDL 71 06/22/2016   LDLCALC 47 06/22/2016   TRIG 59 06/22/2016   CHOLHDL 1.8 06/22/2016    Significant Diagnostic Results in last 30 days:  Dg Hip Operative Unilat W Or W/o Pelvis Right  Result Date: 06/21/2016 CLINICAL DATA:  Intraoperative repair hip fracture. EXAM: OPERATIVE RIGHT HIP (WITH PELVIS IF PERFORMED) 2 VIEWS TECHNIQUE: Fluoroscopic spot image(s) were submitted for interpretation post-operatively. COMPARISON:  None. FINDINGS: Three screws now cross the right hip fracture. IMPRESSION: Right hip fracture repair as above. Electronically Signed   By: Gerome Sam III M.D   On: 06/21/2016 17:22    Assessment/Plan 1. Pneumonia of both lower lobes due to infectious organism [facility])  Resolved  2. Closed fracture of right hip with routine healing, subsequent encounter  Continue Tylenol 650 mg po QID scheduled for pain  3. Chronic midline low back pain without sciatica  Tylenol as listed above  4.  Rash of back  DC Lidocaine patch  Hydrocortisone 1% cream- thin film to rash BID until resolved  Family/ staff Communication:   Total Time:   Documentation:  Face to Face:   Family/Phone:    Labs/tests ordered:    Medication list reviewed and assessed for continued appropriateness.  Brynda Rim, NP-C Geriatrics [facility] [phone] [address], [place] [address] Cell Phone (Mon-Fri 8am-5pm):  [phone] On Call:  [phone] & follow prompts after 5pm & weekends Office Phone:  [phone] Office Fax:  [phone]

## 2016-07-23 ENCOUNTER — Non-Acute Institutional Stay (SKILLED_NURSING_FACILITY): Payer: Medicare Other | Admitting: Gerontology

## 2016-07-23 DIAGNOSIS — G8929 Other chronic pain: Secondary | ICD-10-CM | POA: Diagnosis not present

## 2016-07-23 DIAGNOSIS — J181 Lobar pneumonia, unspecified organism: Secondary | ICD-10-CM | POA: Diagnosis not present

## 2016-07-23 DIAGNOSIS — R21 Rash and other nonspecific skin eruption: Secondary | ICD-10-CM | POA: Diagnosis not present

## 2016-07-23 DIAGNOSIS — J189 Pneumonia, unspecified organism: Secondary | ICD-10-CM

## 2016-07-23 DIAGNOSIS — S72001D Fracture of unspecified part of neck of right femur, subsequent encounter for closed fracture with routine healing: Secondary | ICD-10-CM

## 2016-07-23 DIAGNOSIS — M545 Low back pain: Secondary | ICD-10-CM

## 2016-07-23 NOTE — Progress Notes (Signed)
Location:      Place of Service:  SNF (31) Provider:  Lorenso Quarry, NP-C  Corky Downs, MD  Patient Care Team: Corky Downs, MD as PCP - General (Internal Medicine)  Extended Emergency Contact Information Primary Emergency Contact: Nicole Cella Address: [address]          [place], [place] [address] [place] Home Phone: [phone] Mobile Phone: [phone] Relation: Daughter Secondary Emergency Contact: Gerda [place] Mobile Phone: [phone] Relation: None  Code Status:  DNR Goals of care: Advanced Directive information Advanced Directives 06/21/2016  Does Patient Have a Medical Advance Directive? No  Would patient like information on creating a medical advance directive? No - Patient declined     Chief Complaint  Patient presents with  . Discharge Note    HPI:  Pt is a 83 y.o. female seen today for a discharge evaluation visit for Pneumonia, somnolence, back pain and hip fracture and acute visit for rash on the lower back. Pneumonia is now resolved. Pain from the hip fracture is well controlled. She is now walking with therapy down the halls. She is stand-by assist with the walker. No cough. Lungs clear. Voiding well. Pt has not required any meds for pain aside from the scheduled Tylenol. Raised, erythematis, itchy rash in the area of the Lidocaine patch. Pt reports itching is improved since the patch was removed today. No pustules. No drainage. Dry, scabbed areas. Palliative to still follow pt. No continued complaints of difficulty hearing. Pt denies n/v/d/f/c/cp/sob/ha/abd pain/dizziness/cough. Will need Home health arranged and equipment for home use including a lightweight wheelchair. Pt reports she is feeling well and excited about going home. VSS. No other complaints.    Past Medical History:  Diagnosis Date  . Arthritis   . HLD (hyperlipidemia)   . HTN (hypertension)   . Hypothyroidism   . Urinary frequency     Past Surgical History:  Procedure Laterality Date  . HIP PINNING,CANNULATED Right 06/21/2016   Procedure: CANNULATED HIP PINNING;  Surgeon: Danelle Earthly, MD;  Location: [facility] ORS;  Service: Orthopedics;  Laterality: Right;  . INCONTINENCE SURGERY      No Known Allergies  Allergies as of 07/23/2016   No Known Allergies     Medication List       Accurate as of 07/23/16  2:33 PM. Always use your most recent med list.          acetaminophen 650 MG suppository Commonly known as:  TYLENOL Place 1 suppository (650 mg total) rectally every 6 (six) hours as needed for mild pain (or Fever >/= 101).   ALPRAZolam 0.5 MG tablet Commonly known as:  XANAX Take 1 tablet (0.5 mg total) by mouth at bedtime.   amLODipine 2.5 MG tablet Commonly known as:  NORVASC Take 1 tablet by mouth daily.   aspirin EC 81 MG tablet Take 81 mg by mouth at bedtime.   atorvastatin 40 MG tablet Commonly known as:  LIPITOR Take 40 mg by mouth at bedtime.   cephALEXin 500 MG capsule Commonly known as:  KEFLEX Take 1 capsule (500 mg total) by mouth 2 (two) times daily.   cholecalciferol 1000 units tablet Commonly known as:  VITAMIN D Take 4,000 Units by mouth daily.   Co Q-10 200 MG Caps Take 1 capsule by mouth daily.   docusate sodium 100 MG capsule Commonly known as:  COLACE Take 1 capsule (100 mg total) by mouth daily.   enoxaparin 30 MG/0.3ML injection  Commonly known as:  LOVENOX Inject 0.3 mLs (30 mg total) into the skin daily.   levothyroxine 100 MCG tablet Commonly known as:  SYNTHROID, LEVOTHROID Take 100 mcg by mouth daily before breakfast.   lisinopril 2.5 MG tablet Commonly known as:  PRINIVIL,ZESTRIL Take 2.5 mg by mouth daily.   metFORMIN 1000 MG tablet Commonly known as:  GLUCOPHAGE Take 1,000 mg by mouth 2 (two) times daily with a meal.   multivitamin tablet Take 1 tablet by mouth daily.   traMADol 50 MG tablet Commonly known as:  ULTRAM Take 1 tablet (50 mg total)  by mouth every 6 (six) hours as needed for moderate pain.   vitamin C 1000 MG tablet Take 1,000 mg by mouth daily.       Review of Systems  Constitutional: Negative for activity change, appetite change, chills, diaphoresis, fatigue (with exertion) and fever.  HENT: Negative for congestion, sneezing, sore throat, trouble swallowing and voice change.   Respiratory: Negative for apnea, cough, choking, chest tightness, shortness of breath and wheezing.   Cardiovascular: Negative for chest pain, palpitations and leg swelling.  Gastrointestinal: Negative for abdominal distention, abdominal pain, constipation, diarrhea and nausea.  Genitourinary: Negative for difficulty urinating, dysuria, frequency and urgency.  Musculoskeletal: Positive for arthralgias (typical arthritis). Negative for back pain, gait problem, joint swelling and myalgias.  Skin: Positive for rash. Negative for color change, pallor and wound.  Neurological: Negative for dizziness, tremors, syncope, speech difficulty, weakness, numbness and headaches.  Psychiatric/Behavioral: Negative for agitation and behavioral problems.  All other systems reviewed and are negative.    There is no immunization history on file for this patient. Pertinent  Health Maintenance Due  Topic Date Due  . DEXA SCAN  06-Mar-201984  . PNA vac Low Risk Adult (1 of 2 - PCV13) 06-Mar-201984  . INFLUENZA VACCINE  09/02/2016   No flowsheet data found. Functional Status Survey:    Vitals:   07/23/16 0600  BP: (!) 133/53  Pulse: 72  Resp: 20  Temp: 97.8 F (36.6 C)  SpO2: 97%   There is no height or weight on file to calculate BMI. Physical Exam  Constitutional: She is oriented to person, place, and time. Vital signs are normal. She appears well-developed and well-nourished. She is active and cooperative. She appears ill. No distress. Nasal cannula in place.  HENT:  Head: Normocephalic and atraumatic.  Mouth/Throat: Uvula is midline, oropharynx is  clear and moist and mucous membranes are normal. Mucous membranes are not pale, not dry and not cyanotic.  Eyes: Conjunctivae, EOM and lids are normal. Pupils are equal, round, and reactive to light.  Neck: Trachea normal, normal range of motion and full passive range of motion without pain. Neck supple. No JVD present. No tracheal deviation, no edema and no erythema present. No thyromegaly present.  Cardiovascular: Normal rate, intact distal pulses and normal pulses.  An irregular rhythm present. Exam reveals no gallop, no distant heart sounds and no friction rub.   Murmur heard. Pulses:      Dorsalis pedis pulses are 2+ on the right side, and 2+ on the left side.  Pulmonary/Chest: Effort normal. No accessory muscle usage. No respiratory distress. She has decreased breath sounds (shallow respirations) in the right lower field and the left lower field. She has no wheezes. She has no rhonchi. She has no rales. She exhibits no tenderness.  Abdominal: Soft. Normal appearance. She exhibits no distension and no ascites. Bowel sounds are decreased. There is no tenderness.  Musculoskeletal: She exhibits no edema.       Right hip: She exhibits decreased strength and laceration. She exhibits normal range of motion and no tenderness.  Expected osteoarthritis, stiffness; calves soft, supple. Negative Homan's sign  Neurological: She is alert and oriented to person, place, and time. She has normal strength.  Skin: Skin is warm and dry. Rash noted. No laceration (right hip incision- healed) noted. Rash is urticarial. She is not diaphoretic. No cyanosis or erythema. No pallor. Nails show no clubbing.     Psychiatric: She has a normal mood and affect. Her speech is normal and behavior is normal. Judgment and thought content normal. Cognition and memory are normal.  Nursing note and vitals reviewed.   Labs reviewed:  Recent Labs  06/23/16 0422 06/25/16 1338 07/09/16 1410  NA 139 134* 137  K 3.9 4.7 3.5    CL 103 96* 97*  CO2 26 32 32  GLUCOSE 151* 125* 194*  BUN 13 17 14   CREATININE 0.62 0.50 0.52  CALCIUM 9.3 9.5 9.0    Recent Labs  06/22/16 0425 06/25/16 1338 07/09/16 1410  AST 23 19 26   ALT 15 11* 14  ALKPHOS 109 97 144*  BILITOT 0.9 0.6 0.5  PROT 6.8 6.7 6.2*  ALBUMIN 3.3* 2.9* 2.7*    Recent Labs  06/21/16 0835 06/22/16 0425 06/25/16 1338 07/09/16 1410  WBC 15.3* 15.6* 11.0 14.2*  NEUTROABS 12.4*  --  8.2* 11.6*  HGB 12.6 12.0 12.1 11.7*  HCT 37.5 36.4 36.5 35.1  MCV 99.0 99.8 100.3* 100.4*  PLT 235 216 244 394   Lab Results  Component Value Date   TSH 0.985 06/22/2016   Lab Results  Component Value Date   HGBA1C 8.1 (H) 06/22/2016   Lab Results  Component Value Date   CHOL 130 06/22/2016   HDL 71 06/22/2016   LDLCALC 47 06/22/2016   TRIG 59 06/22/2016   CHOLHDL 1.8 06/22/2016    Significant Diagnostic Results in last 30 days:  No results found.  Assessment/Plan 1. Pneumonia of both lower lobes due to infectious organism [facility](HCC)  Resolved  2. Closed fracture of right hip with routine healing, subsequent encounter  Continue Tylenol 650 mg po QID scheduled for pain  Continue PT/OT at home  Continue exercises as taught by PT/OT  Incision care as instructed  Follow up with PCP, Orthopedist asap after discharge for continuity of care  3. Chronic midline low back pain without sciatica  Tylenol as listed above  4.  Rash of back  Hydrocortisone 1% cream- thin film to rash BID until resolved  Family/ staff Communication:   Total Time:   Documentation:  Face to Face:   Family/Phone:    Labs/tests ordered:     Patient is being discharged with the following home health services: HHPT/OT/Nsg/aid    Patient is being discharged with the following durable medical equipment: BSC, tub bench   Patient has been advised to f/u with their PCP in 1-2 weeks to bring them up to date on their rehab stay.  Social services at facility was  responsible for arranging this appointment.  Pt was provided with a 30 day supply of prescriptions for medications and refills must be obtained from their PCP.  For controlled substances, a more limited supply may be provided adequate until PCP appointment only.  Patient needs a lightweight wheelchair.  Patient is able to propel self and maintain current level of independence using a wheelchair.  Patient is unable to perform  these same daily functions with a walker, cane or other alternative assistive device.     Medication list reviewed and assessed for continued appropriateness.  Brynda Rim, NP-C Geriatrics [facility] [phone] [address]. [address], [place] [address] Cell Phone (Mon-Fri 8am-5pm):  [phone] On Call:  [phone] & follow prompts after 5pm & weekends Office Phone:  [phone] Office Fax:  [phone]

## 2016-07-26 NOTE — Progress Notes (Signed)
Location:      Place of Service:  SNF (31) Provider:  Lorenso Quarry, NP-C  Corky Downs, MD  Patient Care Team: Corky Downs, MD as PCP - General (Internal Medicine)  Extended Emergency Contact Information Primary Emergency Contact: Nicole Cella Address: [address]          [place], [place] [address] [place] of [place] Home Phone: [phone] Mobile Phone: [phone] Relation: Daughter Secondary Emergency Contact: Gerda Diss [place] Mobile Phone: [phone] Relation: None  Code Status:  DNR Goals of care: Advanced Directive information Advanced Directives 06/21/2016  Does Patient Have a Medical Advance Directive? No  Would patient like information on creating a medical advance directive? No - Patient declined     Chief Complaint  Patient presents with  . Acute Visit    HPI:  Pt is a 83 y.o. female seen today for an acute visit for rash in the groin as well as cerumen impaction of the ears. Staff noticed a red, irritated, warm rash in the groin. Pt denies itching or pain. No drainage or discharge. No vaginal discharge. Pt is incontinent of urine and wearing a brief. Also, family concerned about pt's decreased hearing d/t visible cerumen. Pt has had full course of Debrox with persistent symptoms. Otherwise, no other complaints. VSS.    Past Medical History:  Diagnosis Date  . Arthritis   . HLD (hyperlipidemia)   . HTN (hypertension)   . Hypothyroidism   . Urinary frequency    Past Surgical History:  Procedure Laterality Date  . HIP PINNING,CANNULATED Right 06/21/2016   Procedure: CANNULATED HIP PINNING;  Surgeon: Danelle Earthly, MD;  Location: [facility] ORS;  Service: Orthopedics;  Laterality: Right;  . INCONTINENCE SURGERY      No Known Allergies  Allergies as of 07/07/2016   No Known Allergies     Medication List       Accurate as of 07/07/16 11:59 PM. Always use your most recent med list.          acetaminophen 650 MG  suppository Commonly known as:  TYLENOL Place 1 suppository (650 mg total) rectally every 6 (six) hours as needed for mild pain (or Fever >/= 101).   ALPRAZolam 0.5 MG tablet Commonly known as:  XANAX Take 1 tablet (0.5 mg total) by mouth at bedtime.   amLODipine 2.5 MG tablet Commonly known as:  NORVASC Take 1 tablet by mouth daily.   aspirin EC 81 MG tablet Take 81 mg by mouth at bedtime.   atorvastatin 40 MG tablet Commonly known as:  LIPITOR Take 40 mg by mouth at bedtime.   cephALEXin 500 MG capsule Commonly known as:  KEFLEX Take 1 capsule (500 mg total) by mouth 2 (two) times daily.   cholecalciferol 1000 units tablet Commonly known as:  VITAMIN D Take 4,000 Units by mouth daily.   Co Q-10 200 MG Caps Take 1 capsule by mouth daily.   docusate sodium 100 MG capsule Commonly known as:  COLACE Take 1 capsule (100 mg total) by mouth daily.   enoxaparin 30 MG/0.3ML injection Commonly known as:  LOVENOX Inject 0.3 mLs (30 mg total) into the skin daily.   levothyroxine 100 MCG tablet Commonly known as:  SYNTHROID, LEVOTHROID Take 100 mcg by mouth daily before breakfast.   lisinopril 2.5 MG tablet Commonly known as:  PRINIVIL,ZESTRIL Take 2.5 mg by mouth daily.   metFORMIN 1000 MG tablet Commonly known as:  GLUCOPHAGE Take 1,000 mg by mouth 2 (two)  times daily with a meal.   multivitamin tablet Take 1 tablet by mouth daily.   traMADol 50 MG tablet Commonly known as:  ULTRAM Take 1 tablet (50 mg total) by mouth every 6 (six) hours as needed for moderate pain.   vitamin C 1000 MG tablet Take 1,000 mg by mouth daily.       Review of Systems  Constitutional: Negative for activity change, appetite change, chills, diaphoresis, fatigue and fever.  HENT: Negative.   Respiratory: Negative.   Gastrointestinal: Negative.   Genitourinary: Negative for hematuria, pelvic pain, vaginal bleeding, vaginal discharge and vaginal pain.  Musculoskeletal: Negative.     Skin: Positive for rash.  Neurological: Negative.   Psychiatric/Behavioral: Negative.      There is no immunization history on file for this patient. Pertinent  Health Maintenance Due  Topic Date Due  . DEXA SCAN  Apr 23, 201984  . PNA vac Low Risk Adult (1 of 2 - PCV13) Apr 23, 201984  . INFLUENZA VACCINE  09/02/2016   No flowsheet data found. Functional Status Survey:    Vitals:   07/07/16 0645  BP: (!) 150/53  Pulse: 72  Resp: 18  Temp: 97.7 F (36.5 C)  Weight: 102 lb (46.3 kg)   Body mass index is 19.92 kg/m. Physical Exam  Constitutional: She appears well-developed and well-nourished. No distress.  HENT:  Right Ear: No lacerations. No drainage, swelling or tenderness. No foreign bodies. No middle ear effusion. Decreased hearing is noted.  Left Ear: No lacerations. No drainage, swelling or tenderness. No foreign bodies.  No middle ear effusion. Decreased hearing is noted.  Unable to visualize TM d/t cerumen in ear canals  Skin: Skin is warm and dry. Rash noted. She is not diaphoretic. There is erythema.       Labs reviewed:  Recent Labs  06/23/16 0422 06/25/16 1338 07/09/16 1410  NA 139 134* 137  K 3.9 4.7 3.5  CL 103 96* 97*  CO2 26 32 32  GLUCOSE 151* 125* 194*  BUN 13 17 14   CREATININE 0.62 0.50 0.52  CALCIUM 9.3 9.5 9.0    Recent Labs  06/22/16 0425 06/25/16 1338 07/09/16 1410  AST 23 19 26   ALT 15 11* 14  ALKPHOS 109 97 144*  BILITOT 0.9 0.6 0.5  PROT 6.8 6.7 6.2*  ALBUMIN 3.3* 2.9* 2.7*    Recent Labs  06/21/16 0835 06/22/16 0425 06/25/16 1338 07/09/16 1410  WBC 15.3* 15.6* 11.0 14.2*  NEUTROABS 12.4*  --  8.2* 11.6*  HGB 12.6 12.0 12.1 11.7*  HCT 37.5 36.4 36.5 35.1  MCV 99.0 99.8 100.3* 100.4*  PLT 235 216 244 394   Lab Results  Component Value Date   TSH 0.985 06/22/2016   Lab Results  Component Value Date   HGBA1C 8.1 (H) 06/22/2016   Lab Results  Component Value Date   CHOL 130 06/22/2016   HDL 71 06/22/2016    LDLCALC 47 06/22/2016   TRIG 59 06/22/2016   CHOLHDL 1.8 06/22/2016    Significant Diagnostic Results in last 30 days:  No results found.  Assessment/Plan 1. Yeast dermatitis  Wash groin with antibacterial soap and water. Dry well. BID  Nystatin cream- thin film to yeast rash in the groin after washing well BID until healed.  2. Bilateral hearing loss due to cerumen impaction  Fill ear canals with mineral oil. Place cottonball in ear behind drops Q HS  Flush ears with warm, soapy water each AM  Continue until ears clear  Family/  staff Communication:   Total Time:  Documentation:  Face to Face:  Family/Phone:   Labs/tests ordered:    Medication list reviewed and assessed for continued appropriateness.  Brynda Rim, NP-C [facility] [phone] [address], [place] [address] Cell Phone (Mon-Fri 8am-5pm):  [phone] On Call:  [phone] & follow prompts after 5pm & weekends Office Phone:  [phone] Office Fax:  [phone]

## 2019-01-13 IMAGING — CR DG HIP (WITH PELVIS) OPERATIVE*R*
2 series · 2 of 2 positions shown · non-contrast
Comparison: None.

CLINICAL DATA: Intraoperative repair hip fracture.

EXAM:
OPERATIVE RIGHT HIP (WITH PELVIS IF PERFORMED) 2 VIEWS
TECHNIQUE: Fluoroscopic spot image(s) were submitted for interpretation
post-operatively.

[cont. (1 of 2)]
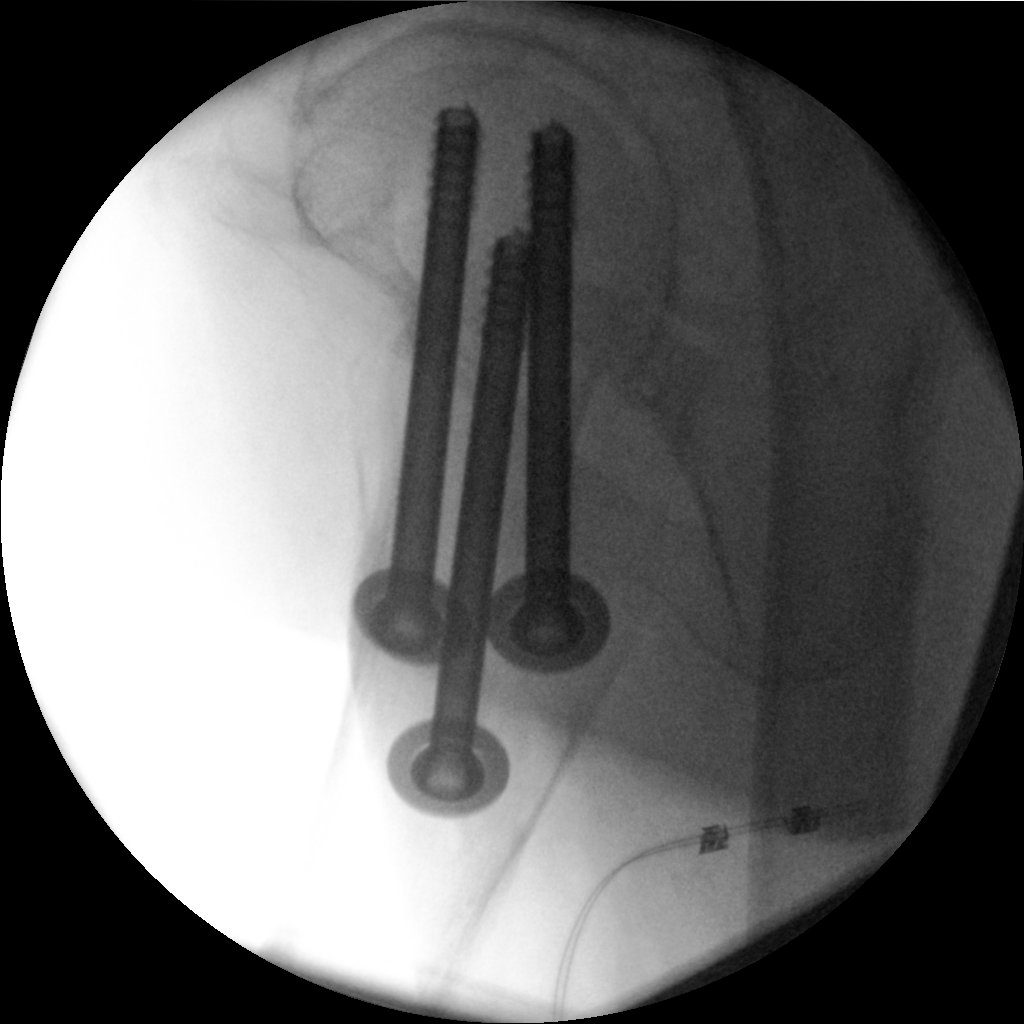

[cont. (2 of 2)]
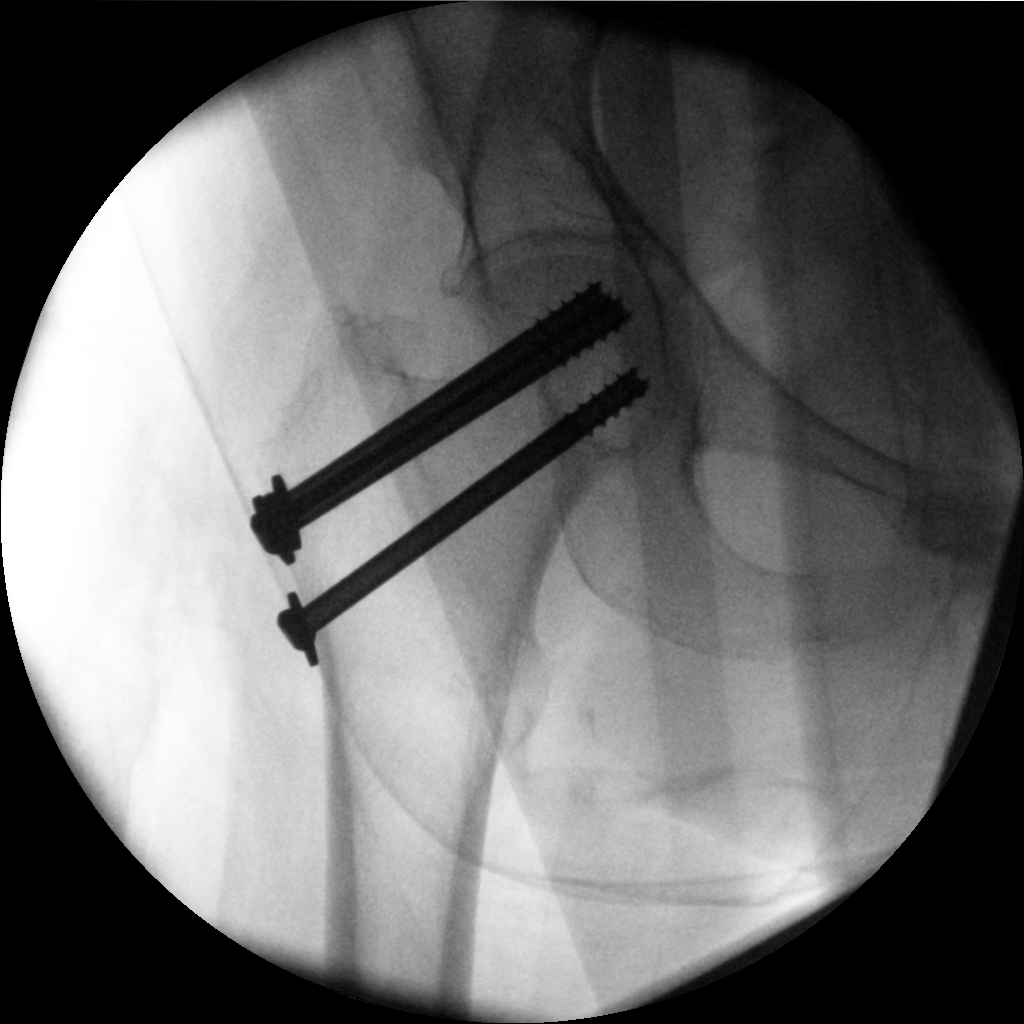

[2 of 2 positions shown; findings below may reference images not displayed]

FINDINGS: Three screws now cross the right hip fracture.
IMPRESSION: Right hip fracture repair as above.

## 2019-01-13 IMAGING — CT CT HEAD W/O CM
4 of 7 series · 14 of 47 positions shown, 15 images · non-contrast
Comparison: 04/24/2005

CLINICAL DATA: Pt came to ED via EMS from home. Pt has unwitnessed
fall, pt does not remember falling. Thinks she slipped out the end
of her bed. Reports right hip pain. Not on blood thinners.

EXAM:
CT HEAD WITHOUT CONTRAST
CT CERVICAL SPINE WITHOUT CONTRAST
TECHNIQUE: Multidetector CT imaging of the head and cervical spine was
performed following the standard protocol without intravenous
contrast. Multiplanar CT image reconstructions of the cervical spine
were also generated.

[Series 2: head wo · axial · 0.42mm/px · z∈[-168,-78]mm · 4 of 32 slices shown, 5 images]
[im 7/32  brain]
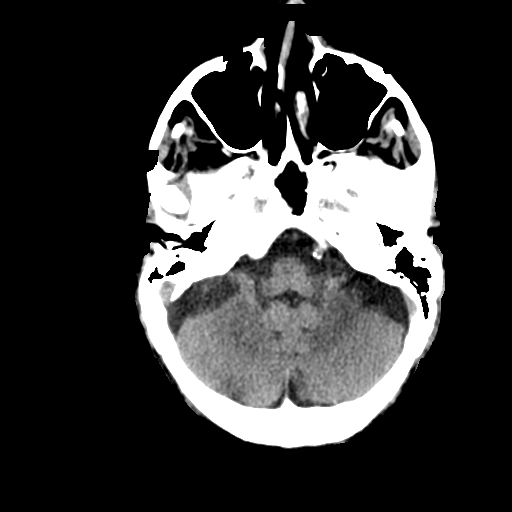
[im 7/32  bone]
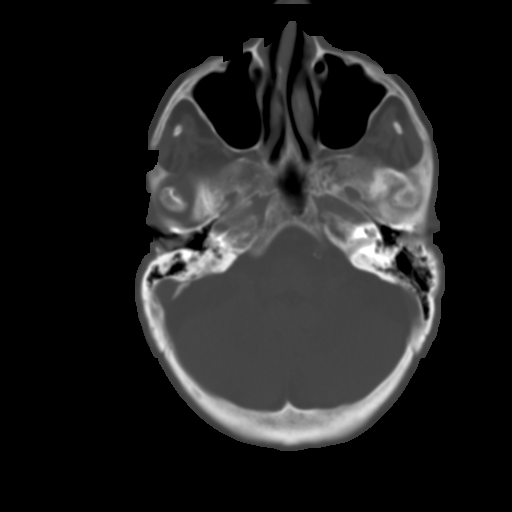
[im 13/32  brain]
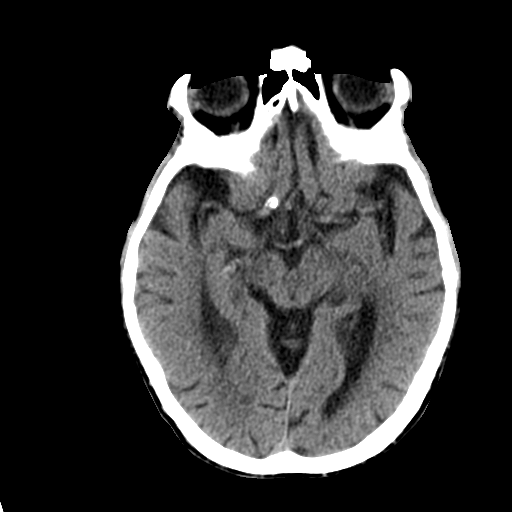
[im 19/32  brain]
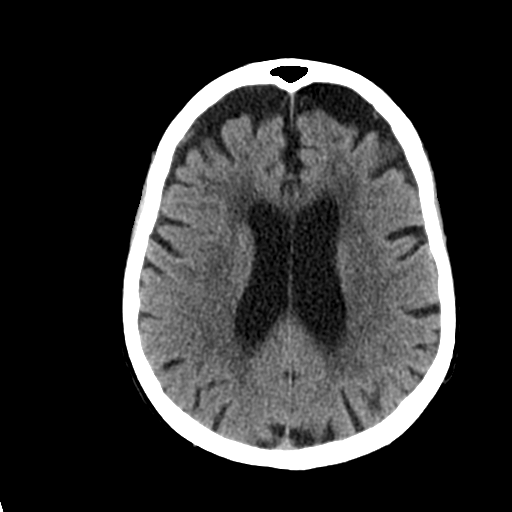
[im 25/32  brain]
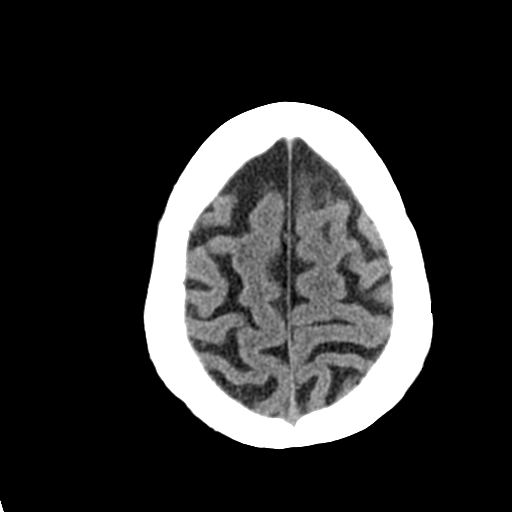

[Series 8: coronal soft tissue · coronal · 0.31mm/px · 3 of 67 slices shown]
[im 13/67  brain]
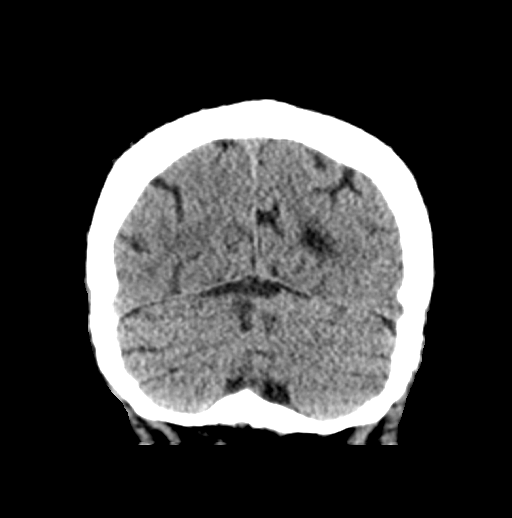
[im 20/67  brain]
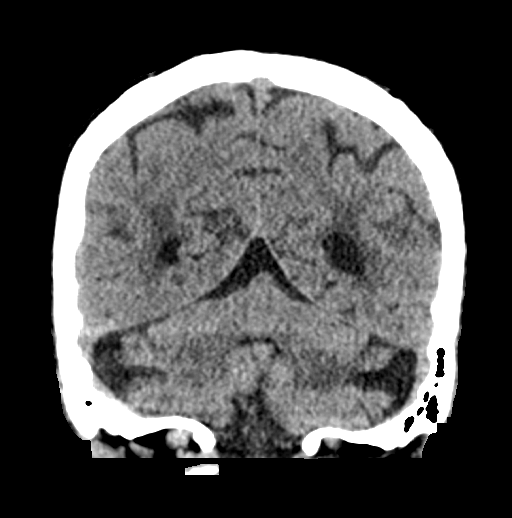
[im 26/67  brain]
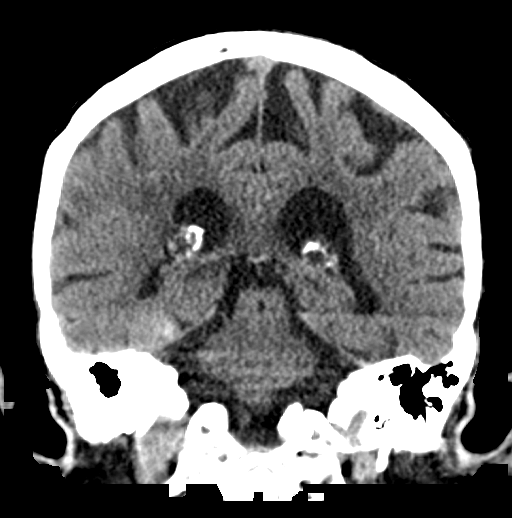

[Series 9: sagittal soft tissue · sagittal · 0.32mm/px · 1 of 52 slices shown]
[im 26/52  brain]
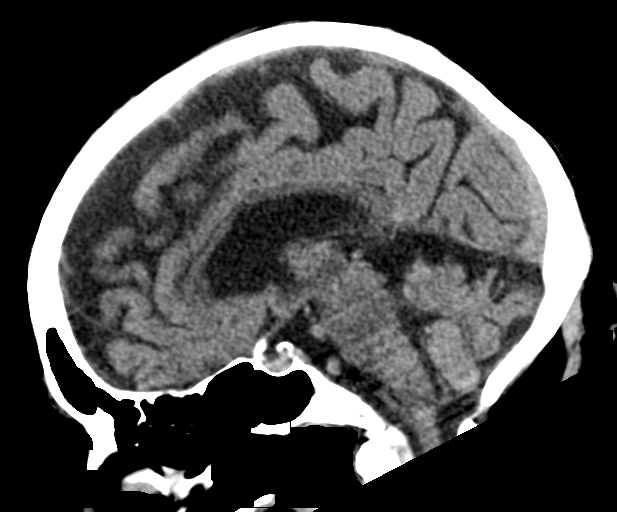

[Series 14: orthogonal bone · axial · 0.23mm/px · z∈[-328,-243]mm · 6 of 78 slices shown]
[im 7/78  bone]
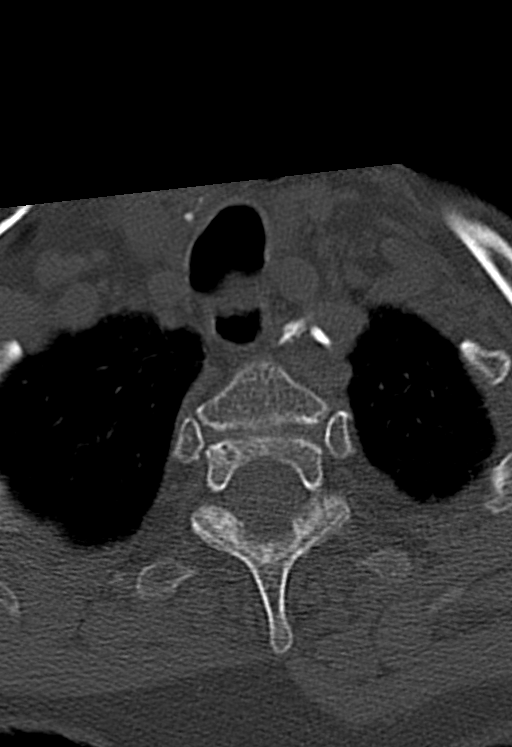
[im 20/78  bone]
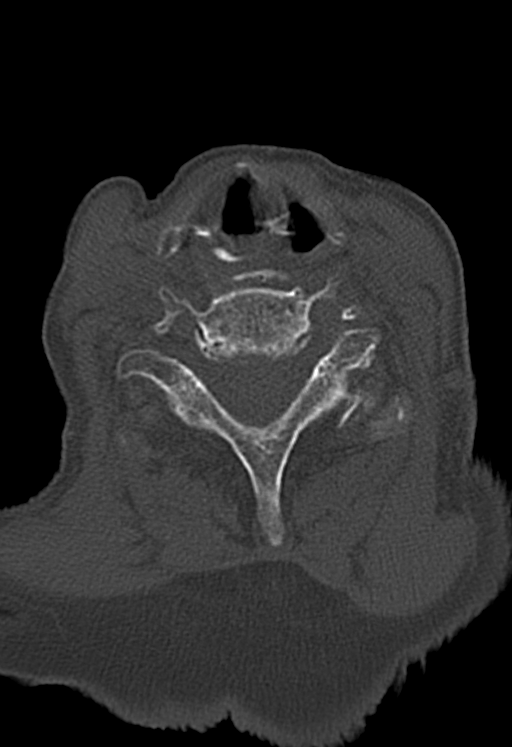
[im 26/78  bone]
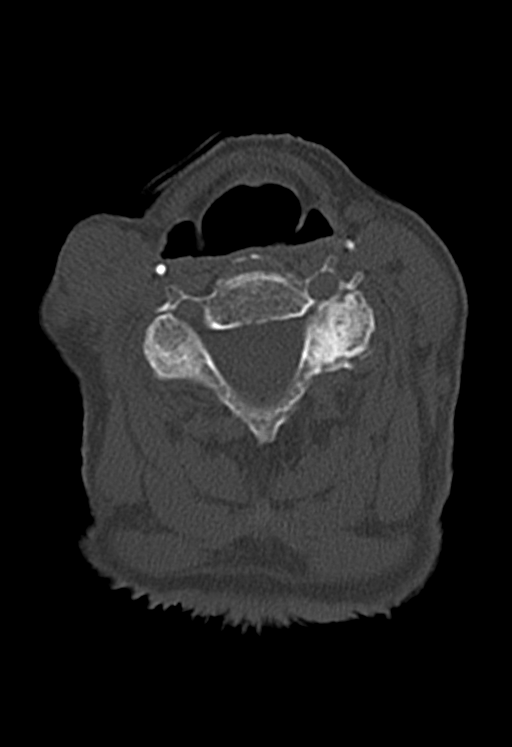
[im 33/78  bone]
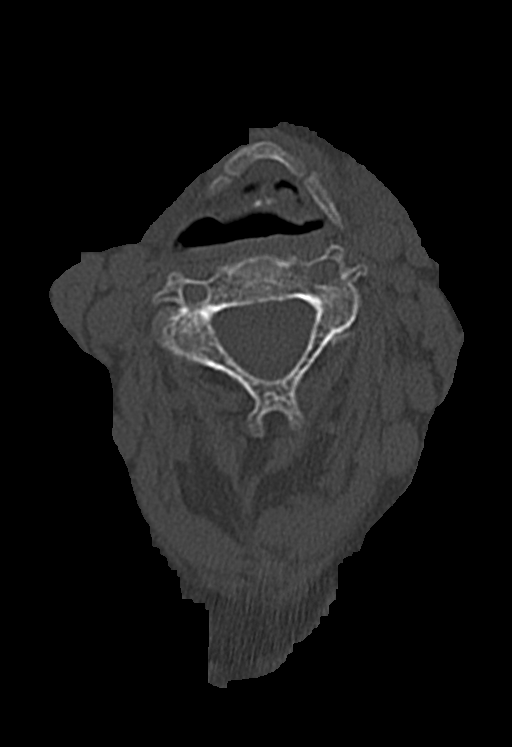
[im 45/78  bone]
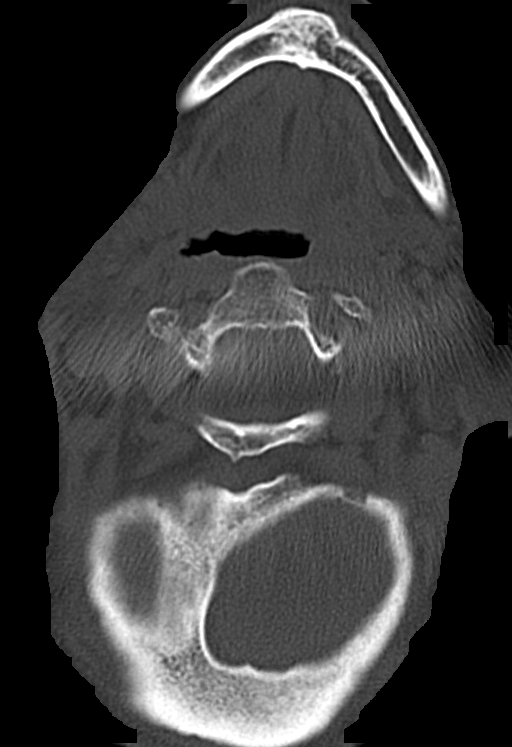
[im 52/78  bone]
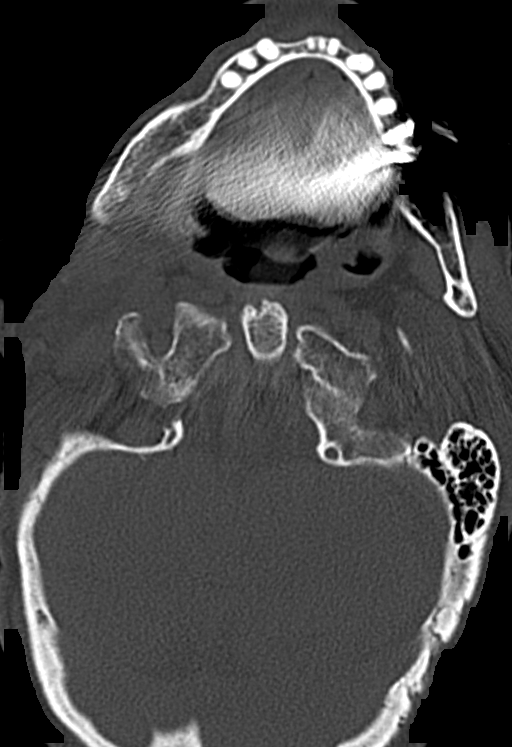

[14 of 47 positions shown; findings below may reference images not displayed]

FINDINGS: CT HEAD FINDINGS

Brain: 2 cm hyperdense lesion in the right middle cranial fossa
abutting the tentorium and involving the temporal lobe. Lesion not
evident on prior study from 1227. No definite dural tail. No acute
intracranial hemorrhage, midline shift, or focal parenchymal edema.
Acute infarct may be inapparent on noncontrast CT. Mild diffuse
parenchymal atrophy. Patchy areas of hypoattenuation in deep and
periventricular white matter bilaterally. Ventricles and sulci
normal in size and symmetry.

Vascular: Atherosclerotic and physiologic intracranial
calcifications.

Skull: Normal. Negative for fracture or focal lesion.

Sinuses/Orbits: No acute finding.

Other: None.

CT CERVICAL SPINE FINDINGS

Alignment: Normal.

Skull base and vertebrae: No acute fracture. No primary bone lesion
or focal pathologic process.

Soft tissues and spinal canal: No prevertebral fluid or swelling. No
visible canal hematoma. Bilateral carotid arterial calcifications.

Disc levels:

C2-3 mild facet DJD right greater than left

C3-4 mild narrowing of the interspace. Facet and uncovertebral DJD
resulting in foraminal stenosis right worse than left.

C4-5 Fusion across the facet joints bilaterally.

C5-6 moderate narrowing of the interspace with posterior protrusion.

C6-7 moderate narrowing of the interspace

Upper chest: Negative.

Other: Streak artifact from dental restorations.
IMPRESSION: 1. Negative for bleed or other acute intracranial process.
2. 2 cm mass in the right middle cranial fossa, new since 1227.
Differential diagnosis includes benign meningioma, less likely
intra-axial neoplasm or atypical aneurysm. Recommend elective MR
head with contrast for further characterization.
3. Negative for cervical fracture or dislocation.
4. Multilevel cervical spondylitic changes as enumerated above.

## 2019-08-12 ENCOUNTER — Other Ambulatory Visit: Payer: Self-pay | Admitting: Internal Medicine

## 2019-08-12 DIAGNOSIS — E7849 Other hyperlipidemia: Secondary | ICD-10-CM

## 2020-02-17 ENCOUNTER — Other Ambulatory Visit: Payer: Self-pay | Admitting: Internal Medicine

## 2020-02-17 DIAGNOSIS — E7849 Other hyperlipidemia: Secondary | ICD-10-CM

## 2020-02-17 DIAGNOSIS — R0602 Shortness of breath: Secondary | ICD-10-CM

## 2020-05-17 ENCOUNTER — Other Ambulatory Visit: Payer: Self-pay | Admitting: Internal Medicine

## 2020-05-17 DIAGNOSIS — I10 Essential (primary) hypertension: Secondary | ICD-10-CM

## 2020-08-23 ENCOUNTER — Other Ambulatory Visit: Payer: Self-pay | Admitting: Internal Medicine

## 2020-08-23 DIAGNOSIS — E7849 Other hyperlipidemia: Secondary | ICD-10-CM

## 2023-12-17 ENCOUNTER — Other Ambulatory Visit: Payer: Self-pay
